# Patient Record
Sex: Female | Born: 1975 | Hispanic: Yes | Marital: Married | Smoking: Never smoker
Health system: Southern US, Community
[De-identification: ages and names within clinical notes are randomized; demographics above are authoritative.]

## PROBLEM LIST (undated history)

## (undated) DIAGNOSIS — K219 Gastro-esophageal reflux disease without esophagitis: Secondary | ICD-10-CM

## (undated) DIAGNOSIS — S0990XS Unspecified injury of head, sequela: Secondary | ICD-10-CM

## (undated) DIAGNOSIS — I10 Essential (primary) hypertension: Secondary | ICD-10-CM

## (undated) DIAGNOSIS — R5383 Other fatigue: Secondary | ICD-10-CM

## (undated) DIAGNOSIS — G4730 Sleep apnea, unspecified: Secondary | ICD-10-CM

## (undated) DIAGNOSIS — G479 Sleep disorder, unspecified: Secondary | ICD-10-CM

## (undated) DIAGNOSIS — M542 Cervicalgia: Secondary | ICD-10-CM

## (undated) DIAGNOSIS — G44309 Post-traumatic headache, unspecified, not intractable: Secondary | ICD-10-CM

## (undated) HISTORY — DX: Other fatigue: R53.83

## (undated) HISTORY — DX: Unspecified injury of head, sequela: S09.90XS

## (undated) HISTORY — DX: Unspecified injury of head, sequela: G44.309

## (undated) HISTORY — DX: Sleep disorder, unspecified: G47.9

## (undated) HISTORY — PX: ESSURE TUBAL LIGATION: SUR464

## (undated) HISTORY — DX: Cervicalgia: M54.2

---

## 2012-08-05 ENCOUNTER — Emergency Department (INDEPENDENT_AMBULATORY_CARE_PROVIDER_SITE_OTHER)
Admission: EM | Admit: 2012-08-05 | Discharge: 2012-08-05 | Disposition: A | Discharge status: Home / Self Care | Payer: Self-pay | Source: Home / Self Care | Attending: Family Medicine | Admitting: Family Medicine

## 2012-08-05 ENCOUNTER — Encounter (HOSPITAL_COMMUNITY): Payer: Self-pay | Admitting: *Deleted

## 2012-08-05 DIAGNOSIS — K0889 Other specified disorders of teeth and supporting structures: Secondary | ICD-10-CM

## 2012-08-05 DIAGNOSIS — K089 Disorder of teeth and supporting structures, unspecified: Secondary | ICD-10-CM

## 2012-08-05 MED ORDER — DICLOFENAC POTASSIUM 50 MG PO TABS: 50.0000 mg | ORAL_TABLET | Freq: Three times a day (TID) | ORAL | Status: DC

## 2012-08-05 MED ORDER — AMOXICILLIN 500 MG PO CAPS: 500.0000 mg | ORAL_CAPSULE | Freq: Three times a day (TID) | ORAL | Status: DC

## 2012-08-05 NOTE — ED Notes (Signed)
C/o R upper jaw toothache onset 2 weeks ago.  No dentist. No insurance.  She said its a bad cavity.

## 2012-08-05 NOTE — ED Provider Notes (Signed)
History     CSN: [id number]  Arrival date & time 08/05/12  1726   First MD Initiated Contact with Patient 08/05/12 1728      No chief complaint on file.   (Consider location/radiation/quality/duration/timing/severity/associated sxs/prior treatment) Patient is a 36 y.o. female presenting with tooth pain. The history is provided by the patient.  Dental PainThe primary symptoms include mouth pain. Primary symptoms do not include fever. The symptoms began more than 1 week ago. The symptoms are worsening. The symptoms occur constantly.  Additional symptoms include: dental sensitivity to temperature, gum swelling and jaw pain. Additional symptoms do not include: facial swelling, trouble swallowing and pain with swallowing.    No past medical history on file.  No past surgical history on file.  No family history on file.  History  Substance Use Topics  . Smoking status: Not on file  . Smokeless tobacco: Not on file  . Alcohol Use: Not on file    OB History    No data available      Review of Systems  Constitutional: Negative.  Negative for fever.  HENT: Positive for dental problem. Negative for facial swelling and trouble swallowing.     Allergies  Review of patient's allergies indicates not on file.  Home Medications   Current Outpatient Rx  Name Route Sig Dispense Refill  . AMOXICILLIN 500 MG PO CAPS Oral Take 1 capsule (500 mg total) by mouth 3 (three) times daily. 30 capsule 0  . DICLOFENAC POTASSIUM 50 MG PO TABS Oral Take 1 tablet (50 mg total) by mouth 3 (three) times daily. For dental pain 15 tablet 0    BP 131/84  Pulse 84  Temp 98.4 F (36.9 C) (Oral)  Resp 18  SpO2 100%  Physical Exam  Nursing note and vitals reviewed. Constitutional: She appears well-developed and well-nourished.  HENT:  Head: Normocephalic.  Right Ear: External ear normal.  Left Ear: External ear normal.  Mouth/Throat: Oropharynx is clear and moist and mucous membranes are  normal. No oropharyngeal exudate.      ED Course  Procedures (including critical care time)  Labs Reviewed - No data to display No results found.   1. Pain, dental       MDM          Linna Hoff, MD 08/05/12 1840

## 2014-03-15 ENCOUNTER — Ambulatory Visit: Payer: Self-pay | Admitting: Internal Medicine

## 2014-05-18 ENCOUNTER — Other Ambulatory Visit: Payer: Self-pay | Admitting: Internal Medicine

## 2014-05-18 ENCOUNTER — Encounter: Payer: Self-pay | Admitting: Internal Medicine

## 2014-05-18 ENCOUNTER — Ambulatory Visit (INDEPENDENT_AMBULATORY_CARE_PROVIDER_SITE_OTHER): Payer: BC Managed Care – PPO | Admitting: Internal Medicine

## 2014-05-18 DIAGNOSIS — R7402 Elevation of levels of lactic acid dehydrogenase (LDH): Secondary | ICD-10-CM

## 2014-05-18 DIAGNOSIS — R740 Nonspecific elevation of levels of transaminase and lactic acid dehydrogenase [LDH]: Secondary | ICD-10-CM

## 2014-05-18 DIAGNOSIS — R7401 Elevation of levels of liver transaminase levels: Secondary | ICD-10-CM

## 2014-05-18 DIAGNOSIS — Z9889 Other specified postprocedural states: Secondary | ICD-10-CM

## 2014-05-18 DIAGNOSIS — IMO0002 Reserved for concepts with insufficient information to code with codable children: Secondary | ICD-10-CM

## 2014-05-18 LAB — COMPREHENSIVE METABOLIC PANEL
ALBUMIN: 3.8 g/dL (ref 3.5–5.2)
ALK PHOS: 115 U/L (ref 39–117)
ALT: 41 U/L — AB (ref 0–35)
AST: 37 U/L (ref 0–37)
BUN: 12 mg/dL (ref 6–23)
CO2: 29 mEq/L (ref 19–32)
CREATININE: 0.89 mg/dL (ref 0.50–1.10)
Calcium: 9.1 mg/dL (ref 8.4–10.5)
Chloride: 100 mEq/L (ref 96–112)
Glucose, Bld: 86 mg/dL (ref 70–99)
Potassium: 4.5 mEq/L (ref 3.5–5.3)
SODIUM: 136 meq/L (ref 135–145)
Total Bilirubin: 0.5 mg/dL (ref 0.2–1.2)
Total Protein: 7.8 g/dL (ref 6.0–8.3)

## 2014-05-18 LAB — CBC WITH DIFFERENTIAL/PLATELET
BASOS ABS: 0.1 10*3/uL (ref 0.0–0.1)
Basophils Relative: 1 % (ref 0–1)
Eosinophils Absolute: 0.2 10*3/uL (ref 0.0–0.7)
Eosinophils Relative: 2 % (ref 0–5)
HEMATOCRIT: 41 % (ref 36.0–46.0)
HEMOGLOBIN: 14.1 g/dL (ref 12.0–15.0)
LYMPHS PCT: 25 % (ref 12–46)
Lymphs Abs: 3 10*3/uL (ref 0.7–4.0)
MCH: 28.4 pg (ref 26.0–34.0)
MCHC: 34.4 g/dL (ref 30.0–36.0)
MCV: 82.7 fL (ref 78.0–100.0)
MONO ABS: 0.7 10*3/uL (ref 0.1–1.0)
MONOS PCT: 6 % (ref 3–12)
NEUTROS ABS: 8 10*3/uL — AB (ref 1.7–7.7)
Neutrophils Relative %: 66 % (ref 43–77)
Platelets: 373 10*3/uL (ref 150–400)
RBC: 4.96 MIL/uL (ref 3.87–5.11)
RDW: 14.3 % (ref 11.5–15.5)
WBC: 12.1 10*3/uL — AB (ref 4.0–10.5)

## 2014-05-18 LAB — LIPID PANEL
Cholesterol: 195 mg/dL (ref 0–200)
HDL: 41 mg/dL (ref >39)
LDL CALC: 92 mg/dL (ref 0–99)
TRIGLYCERIDES: 309 mg/dL — AB (ref <150)
Total CHOL/HDL Ratio: 4.8 Ratio
VLDL: 62 mg/dL — AB (ref 0–40)

## 2014-05-19 LAB — TSH: TSH: 4.271 u[IU]/mL (ref 0.350–4.500)

## 2014-05-19 LAB — T3, FREE: T3, Free: 2.8 pg/mL (ref 2.3–4.2)

## 2014-05-19 LAB — T4, FREE: Free T4: 1.07 ng/dL (ref 0.80–1.80)

## 2014-05-21 ENCOUNTER — Telehealth: Payer: Self-pay | Admitting: Internal Medicine

## 2014-05-21 DIAGNOSIS — IMO0002 Reserved for concepts with insufficient information to code with codable children: Secondary | ICD-10-CM | POA: Insufficient documentation

## 2014-05-21 DIAGNOSIS — R740 Nonspecific elevation of levels of transaminase and lactic acid dehydrogenase [LDH]: Secondary | ICD-10-CM

## 2014-05-21 DIAGNOSIS — E669 Obesity, unspecified: Secondary | ICD-10-CM | POA: Insufficient documentation

## 2014-05-21 DIAGNOSIS — R7401 Elevation of levels of liver transaminase levels: Secondary | ICD-10-CM | POA: Insufficient documentation

## 2014-05-21 NOTE — Telephone Encounter (Signed)
Virginia [name]     Call this pt and let her know that her white blood cell count, her triglycerides and her liver function test are all slightly abnormal  .  Give her 30 min follow up in office with me     Route back to me with appt date  Thanks

## 2014-05-21 NOTE — Progress Notes (Signed)
Subjective:    Patient ID: Virginia [name] [name], female    DOB: 1976/06/17, 38 y.o.   MRN: [id number]  HPI  Virginia [name] is a new pt here for primary care.  She has been in [place] since 2011 formerly [place].  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  She tells me she has upcoming appt with GYN soon  PMH morbid obesity,  S/P hysteroscopic sterilization (IN [place]), and she tells she she was told she had abnormal liver blood test some time ago.  She denies hepatitis exposure or remote blood transfusion and is asymptomatic  LMP 02/2014 she does states she will  " skip months"    [id number] with one ectopic.    [place] pap 2008 [place]  She wishes to discuss weight loss.  She reports that in 2008 she was able to lose weight by vegetarian diet.    She did start at a gym for exercise in 01/2014 but is having difficulty .  Has not had thyroid or other blood work in quite a few years. .    No Known Allergies Past Medical History  Diagnosis Date  . Fatigue   . Sleep difficulties   . Neck pain   . Headaches due to old head injury    Past Surgical History  Procedure Laterality Date  . Essure tubal ligation  20007   History   Social History  . Marital Status: Married    Spouse Name: N/A    Number of Children: N/A  . Years of Education: N/A   Occupational History  . Not on file.   Social History Main Topics  . Smoking status: Never Smoker   . Smokeless tobacco: Not on file  . Alcohol Use: No  . Drug Use: No  . Sexual Activity: Yes    Partners: Male    Birth Control/ Protection: None   Other Topics Concern  . Not on file   Social History Narrative  . No narrative on file   No family history on file. Patient Active Problem List   Diagnosis Date Noted  . Morbid obesity 05/21/2014  . Elevated ALT measurement 05/21/2014  .  Histoyr of Essure hysteroscopic sterilization 2007 05/21/2014   Current Outpatient Prescriptions on File Prior to Visit  Medication Sig Dispense Refill  . acetaminophen  (TYLENOL) 500 MG tablet Take 1,000 mg by mouth every 6 (six) hours as needed.       No current facility-administered medications on file prior to visit.      Review of Systems See HPI    Objective:   Physical Exam Physical Exam  Nursing note and vitals reviewed.  Constitutional: She is oriented to person, place, and time. She appears well-developed and well-nourished.  HENT:  Head: Normocephalic and atraumatic.  Cardiovascular: Normal rate and regular rhythm. Exam reveals no gallop and no friction rub.  No murmur heard.  Pulmonary/Chest: Breath sounds normal. She has no wheezes. She has no rales.  Neurological: She is alert and oriented to person, place, and time.  Skin: Skin is warm and dry.  Psychiatric: She has a normal mood and affect. Her behavior is normal.         Assessment & Plan:  Morbid obesity:  Will get TSH and all labs today.  ADvised Weight to wellness class which starts in September.  Also gave copy of 3 day diet.   History  of abnormal  lfts  Will check today  Further management based on results  Hysteroscopic sterilization with  ESsure  Has gyn appt soon

## 2014-05-22 NOTE — Telephone Encounter (Signed)
Left message for [name] to call the office

## 2014-05-24 ENCOUNTER — Ambulatory Visit (INDEPENDENT_AMBULATORY_CARE_PROVIDER_SITE_OTHER): Payer: BC Managed Care – PPO | Admitting: Internal Medicine

## 2014-05-24 ENCOUNTER — Encounter: Payer: Self-pay | Admitting: Internal Medicine

## 2014-05-24 VITALS — BP 120/78 | HR 77 | Resp 16 | Ht 66.5 in | Wt 295.0 lb

## 2014-05-24 DIAGNOSIS — D72829 Elevated white blood cell count, unspecified: Secondary | ICD-10-CM

## 2014-05-24 DIAGNOSIS — R7989 Other specified abnormal findings of blood chemistry: Secondary | ICD-10-CM

## 2014-05-24 DIAGNOSIS — E781 Pure hyperglyceridemia: Secondary | ICD-10-CM | POA: Insufficient documentation

## 2014-05-24 DIAGNOSIS — E669 Obesity, unspecified: Secondary | ICD-10-CM

## 2014-05-24 DIAGNOSIS — R945 Abnormal results of liver function studies: Secondary | ICD-10-CM

## 2014-05-24 LAB — CBC WITH DIFFERENTIAL/PLATELET
BASOS PCT: 1 % (ref 0–1)
Basophils Absolute: 0.1 10*3/uL (ref 0.0–0.1)
EOS ABS: 0.2 10*3/uL (ref 0.0–0.7)
Eosinophils Relative: 2 % (ref 0–5)
HEMATOCRIT: 39.7 % (ref 36.0–46.0)
HEMOGLOBIN: 13.9 g/dL (ref 12.0–15.0)
Lymphocytes Relative: 22 % (ref 12–46)
Lymphs Abs: 2.5 10*3/uL (ref 0.7–4.0)
MCH: 28.7 pg (ref 26.0–34.0)
MCHC: 35 g/dL (ref 30.0–36.0)
MCV: 82 fL (ref 78.0–100.0)
MONO ABS: 0.7 10*3/uL (ref 0.1–1.0)
MONOS PCT: 6 % (ref 3–12)
Neutro Abs: 7.9 10*3/uL — ABNORMAL HIGH (ref 1.7–7.7)
Neutrophils Relative %: 69 % (ref 43–77)
Platelets: 350 10*3/uL (ref 150–400)
RBC: 4.84 MIL/uL (ref 3.87–5.11)
RDW: 14.2 % (ref 11.5–15.5)
WBC: 11.5 10*3/uL — ABNORMAL HIGH (ref 4.0–10.5)

## 2014-05-24 LAB — COMPREHENSIVE METABOLIC PANEL
ALK PHOS: 117 U/L (ref 39–117)
ALT: 40 U/L — ABNORMAL HIGH (ref 0–35)
AST: 28 U/L (ref 0–37)
Albumin: 3.9 g/dL (ref 3.5–5.2)
BILIRUBIN TOTAL: 0.5 mg/dL (ref 0.2–1.2)
BUN: 11 mg/dL (ref 6–23)
CO2: 25 mEq/L (ref 19–32)
CREATININE: 0.86 mg/dL (ref 0.50–1.10)
Calcium: 9.2 mg/dL (ref 8.4–10.5)
Chloride: 101 mEq/L (ref 96–112)
GLUCOSE: 89 mg/dL (ref 70–99)
Potassium: 4.4 mEq/L (ref 3.5–5.3)
Sodium: 136 mEq/L (ref 135–145)
Total Protein: 7.8 g/dL (ref 6.0–8.3)

## 2014-05-24 NOTE — Addendum Note (Signed)
Addended by: Raechel ChuteSCHOENHOFF, DEBORAH D on: 05/24/2014 02:32 PM   Modules accepted: Orders

## 2014-05-24 NOTE — Progress Notes (Signed)
   Subjective:    Patient ID: Virginia [name], female    DOB: [birth year], 38 y.o.   MRN: [id number]  HPI Virginia [name] is here to follow up on lab abnormalities  Slight elevation in WBC  No infectious symptoms.    Increased TG's  Minimal elevation alt measurement:  Pt states many years ago in [place] she was seen by a specialist for abnormal liver blood test.  She denies hepatitis and states rare ETOH consumption   No Known Allergies Past Medical History  Diagnosis Date  . Fatigue   . Sleep difficulties   . Neck pain   . Headaches due to old head injury    Past Surgical History  Procedure Laterality Date  . Essure tubal ligation  20007   History   Social History  . Marital Status: Married    Spouse Name: N/A    Number of Children: N/A  . Years of Education: N/A   Occupational History  . Not on file.   Social History Main Topics  . Smoking status: Never Smoker   . Smokeless tobacco: Not on file  . Alcohol Use: No  . Drug Use: No  . Sexual Activity: Yes    Partners: Male    Birth Control/ Protection: None, Surgical   Other Topics Concern  . Not on file   Social History Narrative  . No narrative on file   History reviewed. No pertinent family history. Patient Active Problem List   Diagnosis Date Noted  . Hypertriglyceridemia 05/24/2014  . Morbid obesity 05/21/2014  . Elevated ALT measurement 05/21/2014  .  Histoyr of Essure hysteroscopic sterilization 2007 05/21/2014   Current Outpatient Prescriptions on File Prior to Visit  Medication Sig Dispense Refill  . acetaminophen (TYLENOL) 500 MG tablet Take 1,000 mg by mouth every 6 (six) hours as needed.       No current facility-administered medications on file prior to visit.       Review of Systems See HPI    Objective:   Physical Exam Physical Exam  Nursing note and vitals reviewed.  Constitutional: She is oriented to person, place, and time. She appears well-developed and well-nourished.  HENT:    Head: Normocephalic and atraumatic.  Cardiovascular: Normal rate and regular rhythm. Exam reveals no gallop and no friction rub.  No murmur heard.  Pulmonary/Chest: Breath sounds normal. She has no wheezes. She has no rales.  Neurological: She is alert and oriented to person, place, and time.  Skin: Skin is warm and dry.  Psychiatric: She has a normal mood and affect. Her behavior is normal.              Assessment & Plan:  elevatd TG's gave dash diet recheck 6-12 months  Alt elevated  Will get liver U/S  Get ana, ferritin, and repeat labs today   Elevated:  WBC will check today   Obesity:  Will set up nutrition referral

## 2014-05-24 NOTE — Patient Instructions (Signed)
Will refer to nutrition counseling    To lab today     To xray today to schedule ultrasound

## 2014-05-25 LAB — ANA: Anti Nuclear Antibody(ANA): NEGATIVE

## 2014-05-25 LAB — FERRITIN: FERRITIN: 102 ng/mL (ref 10–291)

## 2014-05-28 ENCOUNTER — Ambulatory Visit (HOSPITAL_BASED_OUTPATIENT_CLINIC_OR_DEPARTMENT_OTHER): Admission: RE | Admit: 2014-05-28 | Payer: BC Managed Care – PPO | Source: Ambulatory Visit

## 2014-06-02 ENCOUNTER — Ambulatory Visit (HOSPITAL_BASED_OUTPATIENT_CLINIC_OR_DEPARTMENT_OTHER)
Admission: RE | Admit: 2014-06-02 | Discharge: 2014-06-02 | Disposition: A | Discharge status: Home / Self Care | Payer: BC Managed Care – PPO | Source: Ambulatory Visit | Attending: Internal Medicine | Admitting: Internal Medicine

## 2014-06-02 ENCOUNTER — Ambulatory Visit (HOSPITAL_BASED_OUTPATIENT_CLINIC_OR_DEPARTMENT_OTHER): Payer: BC Managed Care – PPO

## 2014-06-02 DIAGNOSIS — R945 Abnormal results of liver function studies: Secondary | ICD-10-CM

## 2014-06-02 DIAGNOSIS — R7989 Other specified abnormal findings of blood chemistry: Secondary | ICD-10-CM | POA: Insufficient documentation

## 2014-07-25 ENCOUNTER — Ambulatory Visit: Payer: BC Managed Care – PPO | Admitting: Dietician

## 2014-08-28 ENCOUNTER — Encounter: Payer: Self-pay | Admitting: Internal Medicine

## 2014-09-27 ENCOUNTER — Ambulatory Visit: Payer: BC Managed Care – PPO | Admitting: Internal Medicine

## 2014-10-04 ENCOUNTER — Ambulatory Visit (INDEPENDENT_AMBULATORY_CARE_PROVIDER_SITE_OTHER): Payer: BC Managed Care – PPO | Admitting: Internal Medicine

## 2014-10-04 ENCOUNTER — Encounter: Payer: Self-pay | Admitting: Internal Medicine

## 2014-10-04 ENCOUNTER — Ambulatory Visit (HOSPITAL_BASED_OUTPATIENT_CLINIC_OR_DEPARTMENT_OTHER)
Admission: RE | Admit: 2014-10-04 | Discharge: 2014-10-04 | Disposition: A | Discharge status: Home / Self Care | Payer: BC Managed Care – PPO | Source: Ambulatory Visit | Attending: Internal Medicine | Admitting: Internal Medicine

## 2014-10-04 VITALS — BP 125/76 | HR 80 | Resp 16 | Ht 65.5 in | Wt 303.0 lb

## 2014-10-04 DIAGNOSIS — M25551 Pain in right hip: Secondary | ICD-10-CM | POA: Insufficient documentation

## 2014-10-04 LAB — POCT URINALYSIS DIPSTICK
BILIRUBIN UA: NEGATIVE
Blood, UA: NEGATIVE
GLUCOSE UA: NEGATIVE
Ketones, UA: NEGATIVE
LEUKOCYTES UA: NEGATIVE
NITRITE UA: NEGATIVE
PH UA: 6.5
Protein, UA: NEGATIVE
Spec Grav, UA: 1.01
Urobilinogen, UA: NEGATIVE

## 2014-10-04 MED ORDER — IBUPROFEN 800 MG PO TABS: ORAL_TABLET | ORAL | Status: DC

## 2014-10-04 NOTE — Progress Notes (Signed)
   Subjective:    Patient ID: Virginia [name], female    DOB: 24-Jan-1976, 38 y.o.   MRN: [id number]  HPI  04/2014 note elevatd TG's gave dash diet recheck 6-12 months  Alt elevated Will get liver U/S Get ana, ferritin, and repeat labs today   Elevated: WBC will check today   Obesity: Will set up nutrition referral   TODAY:  Virginia [name] is here for acute visit  2 weeks right hip pain and pain in both legs  No edema  No known injury.  Sits at computer at work no lifting     No Known Allergies Past Medical History  Diagnosis Date  . Fatigue   . Sleep difficulties   . Neck pain   . Headaches due to old head injury    Past Surgical History  Procedure Laterality Date  . Essure tubal ligation  20007   History   Social History  . Marital Status: Married    Spouse Name: N/A    Number of Children: N/A  . Years of Education: N/A   Occupational History  . Not on file.   Social History Main Topics  . Smoking status: Never Smoker   . Smokeless tobacco: Not on file  . Alcohol Use: No  . Drug Use: No  . Sexual Activity:    Partners: Male    Birth Control/ Protection: None, Surgical   Other Topics Concern  . Not on file   Social History Narrative   No family history on file. Patient Active Problem List   Diagnosis Date Noted  . Hypertriglyceridemia 05/24/2014  . Morbid obesity 05/21/2014  . Elevated ALT measurement 05/21/2014  .  Histoyr of Essure hysteroscopic sterilization 2007 05/21/2014   Current Outpatient Prescriptions on File Prior to Visit  Medication Sig Dispense Refill  . acetaminophen (TYLENOL) 500 MG tablet Take 1,000 mg by mouth every 6 (six) hours as needed.     No current facility-administered medications on file prior to visit.      Review of Systems See HPI.     Objective:   Physical Exam Physical Exam  Nursing note and vitals reviewed.  Constitutional: She is oriented to person, place, and time. She appears well-developed and  well-nourished.  HENT:  Head: Normocephalic and atraumatic.  Cardiovascular: Normal rate and regular rhythm. Exam reveals no gallop and no friction rub.  No murmur heard.  Pulmonary/Chest: Breath sounds normal. She has no wheezes. She has no rales.  Neurological: She is alert and oriented to person, place, and time.  Skin: Skin is warm and dry.  M/S  R  hip  Pain with I/E rotation  Bil PP  No muscle pain to palpation either leg  Psychiatric: She has a normal mood and affect. Her behavior is normal.      Assessment & Plan:  R hip pain  [facility]   Morbid obesity   See me next week

## 2014-10-04 NOTE — Patient Instructions (Signed)
To xray   See me 30 mins next week

## 2014-10-06 NOTE — Progress Notes (Signed)
Virginia [name] is aware of her xray results-eh

## 2014-10-11 ENCOUNTER — Ambulatory Visit: Payer: BC Managed Care – PPO | Admitting: Internal Medicine

## 2015-01-10 ENCOUNTER — Encounter (HOSPITAL_COMMUNITY): Payer: Self-pay | Admitting: *Deleted

## 2015-01-10 ENCOUNTER — Emergency Department (HOSPITAL_COMMUNITY)
Admission: EM | Admit: 2015-01-10 | Discharge: 2015-01-10 | Disposition: A | Discharge status: Home / Self Care | Payer: BLUE CROSS/BLUE SHIELD | Source: Home / Self Care | Attending: Family Medicine | Admitting: Family Medicine

## 2015-01-10 DIAGNOSIS — K648 Other hemorrhoids: Secondary | ICD-10-CM

## 2015-01-10 DIAGNOSIS — K644 Residual hemorrhoidal skin tags: Secondary | ICD-10-CM

## 2015-01-10 MED ORDER — HYDROCORTISONE ACE-PRAMOXINE 1-1 % RE FOAM: 1.0000 | Freq: Two times a day (BID) | RECTAL | Status: DC

## 2015-01-10 MED ORDER — DOCUSATE SODIUM 100 MG PO CAPS: 100.0000 mg | ORAL_CAPSULE | Freq: Two times a day (BID) | ORAL | Status: DC

## 2015-01-10 NOTE — ED Provider Notes (Signed)
CSN: [id number]     Arrival date & time 01/10/15  1840 History   First MD Initiated Contact with Patient 01/10/15 1958     Chief Complaint  Patient presents with  . Rectal Pain   (Consider location/radiation/quality/duration/timing/severity/associated sxs/prior Treatment) HPI        39 year old female presents complaining of possible hemorrhoid. She has increasing rectal pain and some rectal bleeding for the past 5 days. She has a history of hemorrhoids after she gave birth but none before or since then. She denies any recent constipation and denies any other symptoms. Pain is severe, and she is unable to sit down.  Past Medical History  Diagnosis Date  . Fatigue   . Sleep difficulties   . Neck pain   . Headaches due to old head injury    Past Surgical History  Procedure Laterality Date  . Essure tubal ligation  20007   History reviewed. No pertinent family history. History  Substance Use Topics  . Smoking status: Never Smoker   . Smokeless tobacco: Never Used  . Alcohol Use: No   OB History    Gravida Para Term Preterm AB TAB SAB Ectopic Multiple Living   1         2     Review of Systems  Gastrointestinal: Positive for anal bleeding and rectal pain.  All other systems reviewed and are negative.   Allergies  Review of patient's allergies indicates no known allergies.  Home Medications   Prior to Admission medications   Medication Sig Start Date End Date Taking? Authorizing Provider  acetaminophen (TYLENOL) 500 MG tablet Take 1,000 mg by mouth every 6 (six) hours as needed.    Historical Provider, MD  docusate sodium (COLACE) 100 MG capsule Take 1 capsule (100 mg total) by mouth every 12 (twelve) hours. 01/10/15   Graylon GoodZachary H Tanya Crothers, PA-C  hydrocortisone-pramoxine [facility](PROCTOFOAM-HC) rectal foam Place 1 applicator rectally 2 (two) times daily. 01/10/15   Graylon GoodZachary H Orpheus [place], PA-C  ibuprofen (ADVIL,MOTRIN) 800 MG tablet Take one bid for 10 days 10/04/14   Kendrick Rancheborah D Schoenhoff, MD   Naproxen Sodium (ALEVE PO) Take by mouth.    Historical Provider, MD   BP 121/72 mmHg  Pulse 106  Temp(Src) 98.7 F (37.1 C) (Oral)  Resp 15  SpO2 99%  LMP 01/01/2015 Physical Exam  Constitutional: She is oriented to person, place, and time. Vital signs are normal. She appears well-developed and well-nourished. She appears distressed (uncomfortable appearing).  Obese  HENT:  Head: Normocephalic and atraumatic.  Pulmonary/Chest: Effort normal. No respiratory distress.  Genitourinary: Rectal exam shows external hemorrhoid (numerous).  Rectal exam deferred due to hemorrhoid pain  Neurological: She is alert and oriented to person, place, and time. She has normal strength. Coordination normal.  Skin: Skin is warm and dry. No rash noted. She is not diaphoretic.  Psychiatric: She has a normal mood and affect. Judgment normal.  Nursing note and vitals reviewed.   ED Course  Procedures (including critical care time) Labs Review Labs Reviewed - No data to display  Imaging Review No results found.   MDM   1. Inflamed external hemorrhoid    Treat with proctofoam, Colace, follow-up with primary care if no improvement in a couple days  Meds ordered this encounter  Medications  . hydrocortisone-pramoxine (PROCTOFOAM-HC) rectal foam    Sig: Place 1 applicator rectally 2 (two) times daily.    Dispense:  10 g    Refill:  0  . docusate sodium (COLACE)  100 MG capsule    Sig: Take 1 capsule (100 mg total) by mouth every 12 (twelve) hours.    Dispense:  60 capsule    Refill:  0      Graylon Good, PA-C 01/10/15 2024

## 2015-01-10 NOTE — Discharge Instructions (Signed)

## 2015-01-10 NOTE — ED Notes (Signed)
Pt is here with complaints of rectal pain and bleeding since 01/05/2015

## 2018-09-20 ENCOUNTER — Other Ambulatory Visit (HOSPITAL_COMMUNITY): Payer: Self-pay | Admitting: General Surgery

## 2018-09-29 ENCOUNTER — Other Ambulatory Visit: Payer: Self-pay | Admitting: Obstetrics & Gynecology

## 2018-09-29 DIAGNOSIS — R928 Other abnormal and inconclusive findings on diagnostic imaging of breast: Secondary | ICD-10-CM

## 2018-10-07 ENCOUNTER — Ambulatory Visit (HOSPITAL_COMMUNITY)
Admission: RE | Admit: 2018-10-07 | Discharge: 2018-10-07 | Disposition: A | Discharge status: Home / Self Care | Payer: Managed Care, Other (non HMO) | Source: Ambulatory Visit | Attending: General Surgery | Admitting: General Surgery

## 2018-10-07 ENCOUNTER — Other Ambulatory Visit: Payer: Self-pay

## 2018-10-07 ENCOUNTER — Encounter: Payer: Managed Care, Other (non HMO) | Attending: General Surgery | Admitting: Skilled Nursing Facility1

## 2018-10-07 ENCOUNTER — Encounter: Payer: Self-pay | Admitting: Skilled Nursing Facility1

## 2018-10-07 NOTE — Progress Notes (Signed)
Pre-Op Assessment Visit:  Pre-Operative Sleeve Gastrectomy Surgery  Medical Nutrition Therapy:  Appt start time: 2:30  End time:  3:30  Patient was seen on 12/12/for Pre-Operative Nutrition Assessment. Assessment and [facility] on  10/07/2018  Pt will weigh with her shoes off.   According to pts referral: 4 SWL appts required  Pt states she currently is taking phentermine. Pt states she is picky and does not eat rice and bread. Pt states she has avoiding energy sources due to wanting weight loss. Pt states she is sensitive to smells causing her not to eat. Pt states she has realized she is an eater when she is bored. Pt states he does not eat any seafood stating she vomits just from the smell..  Pt has already been working on a lot of lifestyle changes.   Pt expectation of surgery: a boost  Pt expectation of Dietitian: to educate   Start weight at [facility]: 254 BMI: 42.27  24 hr Dietary Recall: First Meal: slim fast shake someitmes with a banana or cheese eggs and bacon Snack: Second Meal: sandwich wrap or salad: chicken, lettuce, tomato, carrots, cucumbers, cheese with balsamic  Snack:  Third Meal: skipped or chicken and spinach Snack: Beverages: soda (40 ounces), water, coffee, hot tea with sugar and milk, lemon tea  Encouraged to engage in 150 minutes of moderate physical activity including cardiovascular and weight baring weekly  Handouts given during visit include:  . Pre-Op Goals . Bariatric Surgery Protein Shakes During the appointment today the following Pre-Op Goals were reviewed with the patient: . Log your food and beverage  . Make healthy food choices . Begin to limit portion sizes . Limited concentrated sugars and fried foods . Keep fat/sugar in the single digits per serving on             food labels . Practice CHEWING your food  (aim for 30 chews per bite or until applesauce  consistency) . Practice not drinking 15 minutes before, during, and 30 minutes after each meal/snack . Avoid all carbonated beverages  . Avoid/limit caffeinated beverages  . Avoid all sugar-sweetened beverages . Consume 3 meals per day; eat every 3-5 hours . Make a list of non-food related activities . Aim for 64-100 ounces of FLUID daily  . Aim for at least 60-80 grams of PROTEIN daily . Look for a liquid protein source that contain ?15 g protein and ?5 g carbohydrate  (ex: shakes, drinks, shots)  -Follow diet recommendations listed below   Energy and Macronutrient Recomendations: Calories: 1500 Carbohydrate: 170 Protein: 112 Fat: 42  Demonstrated degree of understanding via:  Teach Back  Teaching Method Utilized:  Visual Auditory Hands on  Barriers to learning/adherence to lifestyle change: none identified   Patient to call the Nutrition and Diabetes Education Services to enroll in Pre-Op and Post-Op Nutrition Education when surgery date is scheduled.

## 2018-10-15 ENCOUNTER — Ambulatory Visit
Admission: RE | Admit: 2018-10-15 | Discharge: 2018-10-15 | Disposition: A | Discharge status: Home / Self Care | Payer: BLUE CROSS/BLUE SHIELD | Source: Ambulatory Visit | Attending: Obstetrics & Gynecology | Admitting: Obstetrics & Gynecology

## 2018-10-15 ENCOUNTER — Ambulatory Visit
Admission: RE | Admit: 2018-10-15 | Discharge: 2018-10-15 | Disposition: A | Discharge status: Home / Self Care | Payer: Managed Care, Other (non HMO) | Source: Ambulatory Visit | Attending: Obstetrics & Gynecology | Admitting: Obstetrics & Gynecology

## 2018-10-15 ENCOUNTER — Other Ambulatory Visit: Payer: Self-pay | Admitting: Obstetrics & Gynecology

## 2018-10-15 DIAGNOSIS — R928 Other abnormal and inconclusive findings on diagnostic imaging of breast: Secondary | ICD-10-CM

## 2018-10-15 DIAGNOSIS — N6489 Other specified disorders of breast: Secondary | ICD-10-CM

## 2018-11-08 ENCOUNTER — Ambulatory Visit: Payer: Managed Care, Other (non HMO) | Admitting: Skilled Nursing Facility1

## 2018-11-12 ENCOUNTER — Encounter: Payer: Self-pay | Admitting: Dietician

## 2018-11-12 ENCOUNTER — Encounter: Payer: Managed Care, Other (non HMO) | Attending: General Surgery | Admitting: Dietician

## 2018-11-12 VITALS — Wt 258.0 lb

## 2018-11-12 DIAGNOSIS — E669 Obesity, unspecified: Secondary | ICD-10-CM

## 2018-11-12 DIAGNOSIS — Z6841 Body Mass Index (BMI) 40.0 and over, adult: Secondary | ICD-10-CM | POA: Diagnosis not present

## 2018-11-12 DIAGNOSIS — Z713 Dietary counseling and surveillance: Secondary | ICD-10-CM | POA: Insufficient documentation

## 2018-11-12 NOTE — Patient Instructions (Addendum)
   Use the Meal Ideas sheet to create new ideas for well-balanced meals.   Continue aiming for 64-100 ounces of fluid per day, with at least half being plain water.   Continue cutting back on sodas to avoid carbonation and sugar-sweetened beverages.   Keep tracking your fluids and start tracking your foods to help determine your protein intake. Remember the protein goal is 60 grams/day.
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
## 2018-11-23 ENCOUNTER — Ambulatory Visit (INDEPENDENT_AMBULATORY_CARE_PROVIDER_SITE_OTHER): Payer: Managed Care, Other (non HMO) | Admitting: Neurology

## 2018-11-23 ENCOUNTER — Encounter: Payer: Self-pay | Admitting: Neurology

## 2018-11-23 VITALS — BP 151/100 | HR 106 | Ht 66.0 in | Wt 260.0 lb

## 2018-11-23 DIAGNOSIS — G4719 Other hypersomnia: Secondary | ICD-10-CM

## 2018-11-23 DIAGNOSIS — R030 Elevated blood-pressure reading, without diagnosis of hypertension: Secondary | ICD-10-CM | POA: Diagnosis not present

## 2018-11-23 DIAGNOSIS — Z9189 Other specified personal risk factors, not elsewhere classified: Secondary | ICD-10-CM | POA: Diagnosis not present

## 2018-11-23 DIAGNOSIS — R0683 Snoring: Secondary | ICD-10-CM | POA: Diagnosis not present

## 2018-11-23 DIAGNOSIS — Z6841 Body Mass Index (BMI) 40.0 and over, adult: Secondary | ICD-10-CM

## 2018-11-23 NOTE — Patient Instructions (Signed)

## 2018-11-23 NOTE — Progress Notes (Signed)
Subjective:    Patient ID: Virginia [name] is a 43 y.o. female.  HPI     Virginia Foley, MD, PhD [facility] [address], [address] [address] [place], [place] [address]  Dear Dr. Johna [name],  I saw your patient, Virginia [name], upon your kind request in my sleep clinic today for initial consultation of her sleep disorder, in particular, concern for underlying obstructive sleep apnea. The patient is unaccompanied today. As you know, Virginia [name] is a 43 year old right-handed woman with an underlying medical history of hypothyroidism and morbid obesity with a BMI of over 40, who is being considered for weight loss surgery. I reviewed your office note from 09/10/2018, which you kindly included. Her Epworth sleepiness score is 14 out of 24, fatigue score is 36 out of 63. She lives with her husband and children, she works as a Best boy for [employer] She is a nonsmoker and does not utilize alcohol, except on rare occasions, drinks caffeine in the form of soda and coffee, 16 ounce per day on average total. Per husband, she snores loudly. No night to night nocturia, has had some early morning awakenings. She has a 42 yo daughter, a 21 yo son and 53 yo stepson in [place]. She denies any difficulty with sleep onset. She has one cat. There is no TV in the bedroom. She admits to having dozed off at the wheel before. Her bedtime is generally between 10 and midnight, rise time between 5 and 5:30 AM. She denies difficulty going to sleep, she is not aware of any family history of OSA.   Her Past Medical History Is Significant For: Past Medical History:  Diagnosis Date  . Fatigue   . Headaches due to old head injury   . Neck pain   . Sleep difficulties     Her Past Surgical History Is Significant For: Past Surgical History:  Procedure Laterality Date  . ESSURE TUBAL LIGATION  20007    Her Family History Is Significant For: No family history on  file.  Her Social History Is Significant For: Social History   Socioeconomic History  . Marital status: Married    Spouse name: Not on file  . Number of children: Not on file  . Years of education: Not on file  . Highest education level: Not on file  Occupational History  . Not on file  Social Needs  . Financial resource strain: Not on file  . Food insecurity:    Worry: Not on file    Inability: Not on file  . Transportation needs:    Medical: Not on file    Non-medical: Not on file  Tobacco Use  . Smoking status: Never Smoker  . Smokeless tobacco: Never Used  Substance and Sexual Activity  . Alcohol use: No    Alcohol/week: 0.0 standard drinks  . Drug use: No  . Sexual activity: Yes    Partners: Male    Birth control/protection: None, Surgical  Lifestyle  . Physical activity:    Days per week: Not on file    Minutes per session: Not on file  . Stress: Not on file  Relationships  . Social connections:    Talks on phone: Not on file    Gets together: Not on file    Attends religious service: Not on file    Active member of club or organization: Not on file    Attends meetings of clubs or organizations: Not on file    Relationship  status: Not on file  Other Topics Concern  . Not on file  Social History Narrative  . Not on file    Her Allergies Are:  No Known Allergies:   Her Current Medications Are:  Outpatient Encounter Medications as of 11/23/2018  Medication Sig  . acetaminophen (TYLENOL) 500 MG tablet Take 1,000 mg by mouth every 6 (six) hours as needed.  Marland Kitchen levothyroxine (SYNTHROID, LEVOTHROID) 25 MCG tablet Take 25 mcg by mouth daily.  . phentermine 37.5 MG capsule Take 37.5 mg by mouth every morning.  . [DISCONTINUED] docusate sodium (COLACE) 100 MG capsule Take 1 capsule (100 mg total) by mouth every 12 (twelve) hours.  . [DISCONTINUED] hydrocortisone-pramoxine (PROCTOFOAM-HC) rectal foam Place 1 applicator rectally 2 (two) times daily.  .  [DISCONTINUED] ibuprofen (ADVIL,MOTRIN) 800 MG tablet Take one bid for 10 days  . [DISCONTINUED] Naproxen Sodium (ALEVE PO) Take by mouth.   No facility-administered encounter medications on file as of 11/23/2018.   :  Review of Systems:  Out of a complete 14 point review of systems, all are reviewed and negative with the exception of these symptoms as listed below:  Review of Systems  Neurological:       Pt presents today to discuss her sleep. Pt has never had a sleep study but does endorse snoring.  Epworth Sleepiness Scale 0= would never doze 1= slight chance of dozing 2= moderate chance of dozing 3= high chance of dozing  Sitting and reading: 0 Watching TV: 2 Sitting inactive in a public place (ex. Theater or meeting): 3 As a passenger in a car for an hour without a break: 2 Lying down to rest in the afternoon: 3 Sitting and talking to someone: 2 Sitting quietly after lunch (no alcohol): 0 In a car, while stopped in traffic: 2 Total: 14     Objective:  Neurological Exam  Physical Exam Physical Examination:   Vitals:   11/23/18 1552  BP: (!) 151/100  Pulse: (!) 106    General Examination: The patient is a very pleasant 43 y.o. female in no acute distress. She appears well-developed and well-nourished and well groomed.   HEENT: Normocephalic, atraumatic, pupils are equal, round and reactive to light and accommodation. Funduscopic exam is normal with sharp disc margins noted. Extraocular tracking is good without limitation to gaze excursion or nystagmus noted. Normal smooth pursuit is noted. Hearing is grossly intact. Tympanic membranes are clear bilaterally. Face is symmetric with normal facial animation and normal facial sensation. Speech is clear with no dysarthria noted. There is no hypophonia. There is no lip, neck/head, jaw or voice tremor. Neck is supple with full range of passive and active motion. There are no carotid bruits on auscultation. Oropharynx exam  reveals: mild mouth dryness, adequate dental hygiene and moderate airway crowding, due to larger tonsils of 3+, larger. Uvula. Mallampati is class 3, neck circumference 15-5/8 inches. She has a mild overbite. Tongue protrudes centrally and palate elevates symmetrically.  Chest: Clear to auscultation without wheezing, rhonchi or crackles noted.  Heart: S1+S2+0, regular and normal without murmurs, rubs or gallops noted.   Abdomen: Soft, non-tender and non-distended with normal bowel sounds appreciated on auscultation.  Extremities: There is no edema in the legs noted.   Skin: Warm and dry without trophic changes noted.   Musculoskeletal: exam reveals no obvious joint deformities, tenderness or joint swelling or erythema.   Neurologically:  Mental status: The patient is awake, alert and oriented in all 4 spheres. Her immediate and remote  memory, attention, language skills and fund of knowledge are appropriate. There is no evidence of aphasia, agnosia, apraxia or anomia. Speech is clear with normal prosody and enunciation. Thought process is linear. Mood is normal and affect is normal.  Cranial nerves II - XII are as described above under HEENT exam. In addition: shoulder shrug is normal with equal shoulder height noted. Motor exam: Normal bulk, strength and tone is noted. There is no drift, tremor or rebound. Romberg is negative. Fine motor skills and coordination: grossly intact.  Cerebellar testing: No dysmetria or intention tremor. There is no truncal or gait ataxia.  Sensory exam: intact to light touch in the upper and lower extremities.  Gait, station and balance: She stands easily. No veering to one side is noted. No leaning to one side is noted. Posture is age-appropriate and stance is narrow based. Gait shows normal stride length and normal pace. No problems turning are noted. Tandem walk is unremarkable.   Assessment and Plan:   In summary, Virginia [name] is a very pleasant  43 y.o.-year old female  with an underlying medical history of hypothyroidism and morbid obesity with a BMI of over 40, whose history and physical exam are concerning for obstructive sleep apnea (OSA). Since she is being considered for bariatric surgery, underlying obstructive sleep apnea should be excluded and treated if found. I had a long chat with the patient about my findings and the diagnosis of OSA, its prognosis and treatment options. We talked about medical treatments, surgical interventions and non-pharmacological approaches. I explained in particular the risks and ramifications of untreated moderate to severe OSA, especially with respect to developing cardiovascular disease down the Road, including congestive heart failure, difficult to treat hypertension, cardiac arrhythmias, or stroke. Even type 2 diabetes has, in part, been linked to untreated OSA. Symptoms of untreated OSA include daytime sleepiness, memory problems, mood irritability and mood disorder such as depression and anxiety, lack of energy, as well as recurrent headaches, especially morning headaches. We talked about trying to maintain a healthy lifestyle in general, as well as the importance of weight control. I encouraged the patient to eat healthy, exercise daily and keep well hydrated, to keep a scheduled bedtime and wake time routine, to not skip any meals and eat healthy snacks in between meals. I advised the patient not to drive when feeling sleepy. I recommended the following at this time: sleep study with potential positive airway pressure titration. (We will score hypopneas at 4%).   I explained the sleep test procedure to the patient and also outlined possible surgical and non-surgical treatment options of OSA, including the use of a custom-made dental device (which would require a referral to a specialist dentist or oral surgeon), upper airway surgical options, such as pillar implants, radiofrequency surgery, tongue base  surgery, and UPPP (which would involve a referral to an ENT surgeon). Rarely, jaw surgery such as mandibular advancement may be considered.  I also explained the CPAP treatment option to the patient, who indicated that she would be willing to try CPAP if the need arises. I explained the importance of being compliant with PAP treatment, not only for insurance purposes but primarily to improve Her symptoms, and for the patient's long term health benefit, including to reduce Her cardiovascular risks. I answered all her questions today and the patient was in agreement. I plan to see her back after the sleep study is completed and encouraged her to call with any interim questions, concerns, problems or updates.  Thank you very much for allowing me to participate in the care of this nice patient. If I can be of any further assistance to you please do not hesitate to call me at [phone].  Sincerely,   Star Age, MD, PhD

## 2018-12-13 ENCOUNTER — Ambulatory Visit (INDEPENDENT_AMBULATORY_CARE_PROVIDER_SITE_OTHER): Payer: Managed Care, Other (non HMO) | Admitting: Neurology

## 2018-12-13 DIAGNOSIS — G4733 Obstructive sleep apnea (adult) (pediatric): Secondary | ICD-10-CM

## 2018-12-13 DIAGNOSIS — R0683 Snoring: Secondary | ICD-10-CM

## 2018-12-13 DIAGNOSIS — G4719 Other hypersomnia: Secondary | ICD-10-CM

## 2018-12-13 DIAGNOSIS — Z6841 Body Mass Index (BMI) 40.0 and over, adult: Secondary | ICD-10-CM

## 2018-12-13 DIAGNOSIS — R030 Elevated blood-pressure reading, without diagnosis of hypertension: Secondary | ICD-10-CM

## 2018-12-13 DIAGNOSIS — Z9189 Other specified personal risk factors, not elsewhere classified: Secondary | ICD-10-CM

## 2018-12-15 ENCOUNTER — Ambulatory Visit: Payer: Managed Care, Other (non HMO) | Admitting: Dietician

## 2018-12-16 ENCOUNTER — Telehealth: Payer: Self-pay

## 2018-12-16 NOTE — Addendum Note (Signed)
Addended by: [name] on: 12/16/2018 07:48 AM   Modules accepted: Orders

## 2018-12-16 NOTE — Telephone Encounter (Signed)
-----   Message from [name], MD sent at 12/16/2018  7:48 AM EST ----- Patient referred by Dr. Johna Sheriff, seen by me on 11/23/18, HST on 12/13/18.    Please call and notify the patient that the recent home sleep test showed obstructive sleep apnea. OSA is overall mild, but worth treating to see if she feels better after treatment, ie less sleepy and in light of her bariatric surgery plans, I recommend treatment in the form of autoPAP, which means, that we don't have to bring her in for a sleep study with CPAP, but will let her use an autoPAP machine at home, through a DME company (of her choice, or as per insurance requirement). The DME representative will educate her on how to use the machine, how to put the mask on, etc. I have placed an order in the chart. Please send referral, talk to patient, send report to referring MD. We will need a FU in sleep clinic for 10 weeks post-PAP set up, please arrange that with me or one of our NPs. Pls reinforce the importance of compliance. Thanks,   [name], MD, PhD [facility])

## 2018-12-16 NOTE — Procedures (Signed)
[facility] @[facility] [address] [place], [place] [address] NAME:  Virginia [name]                                                     DOB: 05-08-1976 MEDICAL RECORD no: [id number]                                                        DOS: 12/13/2018  REFERRING PHYSICIAN: Glenna Fellows, MD STUDY PERFORMED: Home Sleep Test on Watch [facility] HISTORY: 43 year old right-handed woman with a history of hypothyroidism and morbid obesity, who is being considered for weight loss surgery. Her Epworth sleepiness score is 14 out of 24.  STUDY RESULTS:    Total Recording Time: 6 hrs, 43 mins; Total Sleep Time:  5 hrs, 28 mins Total Apnea/Hypopnea Index (AHI): 10.6/h; RDI: 13.4 /h; REM AHI: 13.1/h Average Oxygen Saturation: 96%; Lowest Oxygen Desaturation:  91%  Total Time Oxygen Saturation Below or at 88 %: 0 minutes  Average Heart Rate: 80 bpm (between 59 and 106 bpm) IMPRESSION: OSA RECOMMENDATION: This home sleep test demonstrates overall mild obstructive sleep apnea with a total AHI of 10.6/hour and O2 nadir of 91%. Given the patient's sleep related complaints, especially daytime sleepiness, and plan for elective surgery for weight loss, I would recommend treatment with positive airway pressure in the form of autoPAP at home. Alternative treatments for OSA in general include weight loss along with avoidance of the supine sleep position, or an oral appliance in appropriate candidates or airway surgery. Please note that untreated obstructive sleep apnea may carry additional perioperative morbidity. Patients with significant obstructive sleep apnea should receive perioperative PAP therapy and the surgeons and particularly the anesthesiologist should be informed of the diagnosis and the severity of the sleep disordered breathing. The patient should be cautioned not to drive, work at heights, or operate dangerous or heavy equipment when tired or sleepy. Review and reiteration  of good sleep hygiene measures should be pursued with any patient. Other causes of the patient's symptoms, including circadian rhythm disturbances, an underlying mood disorder, medication effect and/or an underlying medical problem cannot be ruled out based on this test. Clinical correlation is recommended. The patient and his referring provider will be notified of the test results. The patient will be seen in follow up in sleep clinic at [facility].  I certify that I have reviewed the raw data recording prior to the issuance of this report in accordance with the standards of the American Academy of Sleep Medicine (AASM).  [name], MD, PhD [facility]) Diplomat, ABPN (Neurology and Sleep)

## 2018-12-16 NOTE — Telephone Encounter (Signed)
I called pt to discuss her sleep study results. No answer, VM is full. Will try again later. 

## 2018-12-16 NOTE — Progress Notes (Signed)
Patient referred by Dr. Johna Sheriff, seen by me on 11/23/18, HST on 12/13/18.    Please call and notify the patient that the recent home sleep test showed obstructive sleep apnea. OSA is overall mild, but worth treating to see if she feels better after treatment, ie less sleepy and in light of her bariatric surgery plans, I recommend treatment in the form of autoPAP, which means, that we don't have to bring her in for a sleep study with CPAP, but will let her use an autoPAP machine at home, through a DME company (of her choice, or as per insurance requirement). The DME representative will educate her on how to use the machine, how to put the mask on, etc. I have placed an order in the chart. Please send referral, talk to patient, send report to referring MD. We will need a FU in sleep clinic for 10 weeks post-PAP set up, please arrange that with me or one of our NPs. Pls reinforce the importance of compliance. Thanks,   [name], MD, PhD [facility])

## 2018-12-21 NOTE — Telephone Encounter (Signed)
I called pt. I advised pt that Dr. Frances Furbish reviewed their sleep study results and found that pt has mild osa. Dr. Frances Furbish recommends that pt start an auto pap, especially in light of her bariatric surgery plans. I reviewed PAP compliance expectations with the pt. Pt is agreeable to starting an auto-PAP. I advised pt that an order will be sent to a DME, [facility], and Christoper Allegra will call the pt within about one week after they file with the pt's insurance. Christoper Allegra will show the pt how to use the machine, fit for masks, and troubleshoot the auto-PAP if needed. A follow up appt was made for insurance purposes with Dr. Frances Furbish on 03/23/2019 at 8:30am. Pt verbalized understanding to arrive 15 minutes early and bring their auto-PAP. A letter with all of this information in it will be mailed to the pt as a reminder. I verified with the pt that the address we have on file is correct. Pt verbalized understanding of results. Pt had no questions at this time but was encouraged to call back if questions arise. I have sent the order to [facility] and have received confirmation that they have received the order.

## 2018-12-21 NOTE — Telephone Encounter (Signed)
I called pt again to discuss her sleep study results. VM not available. Will try again later.

## 2018-12-21 NOTE — Telephone Encounter (Signed)
Pt returned call. RN not available. Please call back as soon as available. °

## 2018-12-31 ENCOUNTER — Encounter: Payer: Self-pay | Admitting: Dietician

## 2018-12-31 ENCOUNTER — Encounter: Payer: Managed Care, Other (non HMO) | Attending: General Surgery | Admitting: Dietician

## 2018-12-31 VITALS — Wt 256.0 lb

## 2018-12-31 DIAGNOSIS — Z6841 Body Mass Index (BMI) 40.0 and over, adult: Secondary | ICD-10-CM | POA: Insufficient documentation

## 2018-12-31 DIAGNOSIS — E669 Obesity, unspecified: Secondary | ICD-10-CM

## 2018-12-31 DIAGNOSIS — Z713 Dietary counseling and surveillance: Secondary | ICD-10-CM | POA: Diagnosis not present

## 2018-12-31 NOTE — Patient Instructions (Signed)
   Start taking a multivitamin & vitamin D if you aren't already.   Continue working through [employer], keep up the great work!   See you next month for your next Supervised Visit!

## 2018-12-31 NOTE — Progress Notes (Signed)
Bariatric Supervised Weight Loss Visit Appt Start Time: 9:40am  End Time: 10:00am  Planned Surgery: Sleeve Gastrectomy   2nd out of 4 SWL Appointments   NUTRITION ASSESSMENT  Anthropometrics  Start weight at [facility]: 254 lbs (10/07/2018) Today's weight: 256 lbs Weight change: -2 lbs since previous visit on 11/12/2018 BMI: 41.32 kg/m2    Clinical  Medical Hx: obesity, sleep apnea (new) Medications: levothyroxine, phentermine, tylenol, colace, bactrim, valacyclovir Supplements: biotin   Psychosocial/Lifestyle Pt lives with her husband and 2 children. 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 Pt states she has a hx of nearly falling asleep at the wheel, which has since improved over the past few months since adding carbohydrates back into her diet. Pt has already been working on lifestyle changes prior to her nutrition assessment for surgery.   24-Hr Dietary Recall First Meal: Slim Fast shake (powder + almond milk) Snack: oatmeal  Second Meal: [employer] sandwich (on lettuce wrap or 9-grain bread)  Snack: none stated Third Meal: wheat pasta + meat (or chicken + vegetables)  Snack: none stated Beverages: water + soda/diet soda + sparkling water + hot tea + coffee w/ creamer   Food & Nutrition Related Hx Dietary Hx: Pt states she is not a big eater. Pt states she dislikes seafood, popcorn, and peanut butter. Pt states she loves Pepsi, but she tries to drink only 0-1 per day. Pt states she has added carbohydrates back in to her diet over the past few months since her assessment visit at [facility]. Pt states she has more energy and does not fall asleep at the wheel anymore.  Estimated Daily Fluid Intake: 64 oz GI / Other Notable Symptoms: none stated  Physical Activity  Current average weekly physical activity: walking (plans to start the gym, already has a membership)    Estimated Energy Needs Calories: 1800 Carbohydrate: 200g Protein: 135g Fat:  50g   NUTRITION DIAGNOSIS  Overweight/obesity (Hobson-3.3) related to past poor dietary habits and physical inactivity as evidenced by patient w/ planned Sleeve Gastrectomy surgery following dietary guidelines for continued weight loss.    NUTRITION INTERVENTION  Nutrition counseling (C-1) and education (E-2) to facilitate bariatric surgery goals.  New Pre-Op Goals to Work On . Working on Secretary/administrator.  . Still working on eliminating carbonated/caffeinated beverages (especially Pepsi.)  . Drinking average of 64 oz/day, purchased a 64 oz water bottle with timers to track intake.  . Tracking food and beverage intake.   Handouts Provided Include   Vitamins & Minerals    Learning Style & Readiness for Change Teaching method utilized: Visual & Auditory  Demonstrated degree of understanding via: Teach Back  Barriers to learning/adherence to lifestyle change: None Identified   MONITORING & EVALUATION Dietary intake, weekly physical activity, body weight, and pre-op goals.   Next Steps  Notes for next SWL Visit:   Assess progress made on Pre-Op Goals previously set to work on.   Identify 1-2 more Pre-Op Goals to begin working on.   Patient is to return to [facility] in 1 month for 3rd SWL Visit.

## 2019-02-03 NOTE — Telephone Encounter (Signed)
Pt does not appear to have been set up on auto pap. I called Christoper Allegra, spoke with Crystal. She reports that Christoper Allegra called her multiple times and pt has not called her back.  I called pt to discuss. No answer, left a message asking her to call me back.

## 2019-02-09 ENCOUNTER — Ambulatory Visit: Payer: Managed Care, Other (non HMO) | Admitting: Dietician

## 2019-02-23 ENCOUNTER — Encounter: Payer: Managed Care, Other (non HMO) | Attending: General Surgery | Admitting: Dietician

## 2019-02-23 ENCOUNTER — Encounter: Payer: Self-pay | Admitting: Dietician

## 2019-02-23 ENCOUNTER — Other Ambulatory Visit: Payer: Self-pay

## 2019-02-23 VITALS — Wt 262.0 lb

## 2019-02-23 DIAGNOSIS — Z713 Dietary counseling and surveillance: Secondary | ICD-10-CM | POA: Insufficient documentation

## 2019-02-23 DIAGNOSIS — Z6841 Body Mass Index (BMI) 40.0 and over, adult: Secondary | ICD-10-CM | POA: Diagnosis not present

## 2019-02-23 DIAGNOSIS — E669 Obesity, unspecified: Secondary | ICD-10-CM

## 2019-02-23 NOTE — Patient Instructions (Addendum)
Find something you can do for self care at home. You care a lot for others, be sure to care for yourself too!   Find some good go-to snack options that you enjoy. Have these on hand in the house to snack on when you are hungry between meals.   Look for another beverage option to help decrease soda intake. Try sugar-free flavorings (such as Crystal Light or Nuun) to mix with water, flavor water with fruit/lemon, Gatorade Zero, diet green tea, etc. Remember the goal is 64 ounces of fluid per day, at least half being plain water.   See you next month!

## 2019-02-23 NOTE — Progress Notes (Signed)
Bariatric Supervised Weight Loss Visit Appt Start Time: 4:35pm  End Time: 5:05pm  Planned Surgery: Sleeve Gastrectomy   3rd out of 4 SWL Appointments   NUTRITION ASSESSMENT  Anthropometrics  Start weight at [facility]: 254 lbs (10/07/2018) Today's weight: 262 lbs Weight change: +6 lbs since previous visit on 12/31/2018 BMI: 42.3 kg/m2    Clinical  Medical Hx: obesity, sleep apnea Medications: levothyroxine, phentermine, tylenol, colace, bactrim, valacyclovir Supplements: biotin   Psychosocial/Lifestyle Pt lives with her husband and 2 children. 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 Pt states she has a hx of nearly falling asleep at the wheel, which has since improved over the past few months since adding carbohydrates back into her diet. Pt has already been working on lifestyle changes prior to her nutrition assessment for surgery.  Throughout the past few weeks, pt has been working at home during the coronavirus pandemic. Pt states it has been difficult to find motivation for things (such as not eating out of boredom and doing at-home workouts) and has experienced stress with her husband and 2 children also being at home all the time.   24-Hr Dietary Recall First Meal: cheerios + milk + banana Snack: coffee  Second Meal: 2 eggs + avocado + strawberries + blueberries   Snack: none stated Third Meal: [place] food   Snack: none stated Beverages: water + soda/diet soda + sparkling water + hot tea + coffee w/ creamer   Food & Nutrition Related Hx Dietary Hx: Pt states she is not a big eater. Pt states she dislikes seafood, popcorn, and peanut butter. Pt states she loves Pepsi, but she tries to drink only 0-1 per day. Pt states she has added carbohydrates back in to her diet over the past few months since her assessment visit at [facility]. Pt states she has more energy and does not fall asleep at the wheel anymore.  Pt has been drinking Pepsi more since being  quarantined at home. Additionally, pt states she is trying to cut carbohydrates again but still including them occasionally throughout the week. Pt states she is very conscious of what she eats/drinks, but lacks motivation to make better choices.  Estimated Daily Fluid Intake: 64 oz Supplements: One a Day Women, vitamin D  GI / Other Notable Symptoms: none stated  Physical Activity  Current average weekly physical activity: walking (gym closed dt COVID-19, will walk in the neighborhood if her husband walks with her)    Estimated Energy Needs Calories: 1800 Carbohydrate: 200g Protein: 135g Fat: 50g   NUTRITION DIAGNOSIS  Overweight/obesity ([place]-3.3) related to past poor dietary habits and physical inactivity as evidenced by patient w/ planned Sleeve Gastrectomy surgery following dietary guidelines for continued weight loss.    NUTRITION INTERVENTION  Nutrition counseling (C-1) and education (E-2) to facilitate bariatric surgery goals.  New Pre-Op Goals to Work On . Working on Secretary/administrator.  . Still working on eliminating carbonated/caffeinated beverages (especially Pepsi.)  . Drinking average of 64 oz/day, purchased a 64 oz water bottle with timers to track intake.  . Tracking water intake.  . Taking MVI and D3.   Handouts Provided Include   Balanced Snacks + Bariatric Snack Ideas     Learning Style & Readiness for Change Teaching method utilized: Visual & Auditory  Demonstrated degree of understanding via: Teach Back  Barriers to learning/adherence to lifestyle change: None Identified   MONITORING & EVALUATION Dietary intake, weekly physical activity, body  weight, and pre-op goals.   Next Steps  Notes for next SWL Visit:   Assess progress made on Pre-Op Goals previously set to work on.   Identify 1-2 more Pre-Op Goals to begin working on.   Patient is to return to [facility] in 1 month for 4th SWL Visit.

## 2019-03-16 NOTE — Telephone Encounter (Signed)
I called pt and discussed this with her. She will discuss with her bariatric surgeon the need for auto pap. She will not pursue auto pap if her surgeon does not require it. She will pursue weight loss and an oral appliance. She will call us back if she decides to pursue auto pap. Pt verbalized understanding of recommendations.

## 2019-03-16 NOTE — Telephone Encounter (Signed)
Per sleep study results on 12/13/18: "Total Apnea/Hypopnea Index (AHI): 10.6/h; RDI: 13.4 /h; REM AHI: 13.1/h Average Oxygen Saturation: 96%; Lowest Oxygen Desaturation:  91%  Total Time Oxygen Saturation Below or at 88 %: 0 minutes"

## 2019-03-16 NOTE — Telephone Encounter (Signed)
Her sleep apnea is indeed mild, I suggested treatment because of her plan for bariatric surgery and to see if she would feel better.  Treatment options include weight loss, and I am not sure where she stands with her surgical plans for weight loss.  We could consider an oral appliance but I would actually wait it out if she has ongoing plans to pursue weight loss medically or with surgery. autoPAP or CPAP therapy is not warranted in her case but was an offer for a treatment option that we could pursue. pls reassure pt.

## 2019-03-16 NOTE — Telephone Encounter (Signed)
I called patient this morning regarding her 5/27 initial cpap follow-up. Patient states that she has still not been set up on her cpap and does not plan to be any time soon due to COVID. She is hesitant about the cost of a cpap machine, and also hesitant about the need. She states that she was told that her sleep apnea is minor. I advised her that I would send this info to nurse. She requests a call back to get clarity on the direct need for a cpap. If unable to reach a voicemail is ok. I have cancelled this appt and advised patient to call back if she gets cpap set up.

## 2019-03-16 NOTE — Telephone Encounter (Signed)
I called pt to discuss. No answer, left a message asking her to call me back. 

## 2019-03-23 ENCOUNTER — Ambulatory Visit: Payer: Managed Care, Other (non HMO) | Admitting: Dietician

## 2019-03-23 ENCOUNTER — Ambulatory Visit: Payer: Self-pay | Admitting: Neurology

## 2019-04-07 ENCOUNTER — Other Ambulatory Visit: Payer: Self-pay

## 2019-04-07 ENCOUNTER — Encounter: Payer: Self-pay | Admitting: Dietician

## 2019-04-07 ENCOUNTER — Encounter: Payer: Managed Care, Other (non HMO) | Attending: General Surgery | Admitting: Dietician

## 2019-04-07 DIAGNOSIS — E669 Obesity, unspecified: Secondary | ICD-10-CM

## 2019-04-07 DIAGNOSIS — Z6841 Body Mass Index (BMI) 40.0 and over, adult: Secondary | ICD-10-CM | POA: Insufficient documentation

## 2019-04-07 DIAGNOSIS — Z713 Dietary counseling and surveillance: Secondary | ICD-10-CM | POA: Diagnosis present

## 2019-04-07 NOTE — Patient Instructions (Addendum)
Review the Pre-Op Goals sheet again and make sure you are reminded/ aware of these goals to help prepare you for surgery.   Great progress so far! You've come a long way and are ready.  See you at Pre-Op Class 2 weeks before your surgery!

## 2019-04-07 NOTE — Progress Notes (Signed)
Bariatric Supervised Weight Loss Visit Appt Start Time: 10:35pm  End Time: 10:50am  Planned Surgery: Sleeve Gastrectomy   4th out of 4 SWL Appointments   NUTRITION ASSESSMENT  Anthropometrics  Start weight at [facility]: 254 lbs (10/07/2018) Today's weight: 257.6 lbs Weight change: -4.4 lbs since previous visit on 02/23/2019 BMI: 41.58 kg/m2    Clinical  Medical Hx: obesity, sleep apnea Medications: levothyroxine, phentermine, tylenol, colace, bactrim, valacyclovir Supplements: biotin   Psychosocial/Lifestyle Pt lives with her husband and 2 children. 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 Pt states she has a hx of nearly falling asleep at the wheel, which has since improved over the past few months since adding carbohydrates back into her diet. Pt has already been working on lifestyle changes prior to her nutrition assessment for surgery.  Throughout the past few weeks, pt has been working at home during the coronavirus pandemic. Pt states it has been difficult to find motivation for things (such as not eating out of boredom and doing at-home workouts) and has experienced stress with her husband and 2 children also being at home all the time.   24-Hr Dietary Recall First Meal: eggs + sauteed spinach (or protein shake)  Snack: coffee  Second Meal: 2 eggs + avocado + strawberries + blueberries (or protein shake + fruit)  Snack: none stated Third Meal: sometimes skips   Snack: none stated Beverages: water + sparkling water + hot tea + coffee w/ sugar-free creamer   Food & Nutrition Related Hx Dietary Hx: Pt states she is not a big eater. She has not felt like eating much since it has gotten hotter outside.  Estimated Daily Fluid Intake: 64 oz Supplements: One a Day Women, vitamin D  GI / Other Notable Symptoms: none stated  Physical Activity  Current average weekly physical activity: walking (gym closed dt COVID-19, will walk in the neighborhood if  her husband walks with her)    Estimated Energy Needs Calories: 1800 Carbohydrate: 200g Protein: 135g Fat: 50g   NUTRITION DIAGNOSIS  Overweight/obesity ([place]-3.3) related to past poor dietary habits and physical inactivity as evidenced by patient w/ planned Sleeve Gastrectomy surgery following dietary guidelines for continued weight loss.    NUTRITION INTERVENTION  Nutrition counseling (C-1) and education (E-2) to facilitate bariatric surgery goals.  New Pre-Op Goals to Work On . Working on Passenger transport manager.  . Eliminated carbonated/caffeinated beverages (especially Pepsi) since June 1st  . Drinking average of 64 oz/day, purchased a 64 oz water bottle with timers to track intake.  . Taking MVI and D3.   Handouts Provided Include   Pre-Op Goals  Bariatric Vitamins & Minerals      Learning Style & Readiness for Change Teaching method utilized: Visual & Auditory  Demonstrated degree of understanding via: Teach Back  Barriers to learning/adherence to lifestyle change: None Identified   MONITORING & EVALUATION Dietary intake, weekly physical activity, body weight, and pre-op goals.   Next Steps  Patient is to contact [facility] to schedule Pre-Op and Post-Op Class dates once surgery is scheduled.

## 2019-09-07 ENCOUNTER — Other Ambulatory Visit: Payer: Self-pay

## 2019-09-07 DIAGNOSIS — Z20822 Contact with and (suspected) exposure to covid-19: Secondary | ICD-10-CM

## 2019-09-10 LAB — NOVEL CORONAVIRUS, NAA: SARS-CoV-2, NAA: NOT DETECTED

## 2019-09-18 ENCOUNTER — Encounter (HOSPITAL_BASED_OUTPATIENT_CLINIC_OR_DEPARTMENT_OTHER): Payer: Self-pay | Admitting: Emergency Medicine

## 2019-09-18 ENCOUNTER — Other Ambulatory Visit: Payer: Self-pay

## 2019-09-18 ENCOUNTER — Emergency Department (HOSPITAL_BASED_OUTPATIENT_CLINIC_OR_DEPARTMENT_OTHER)
Admission: EM | Admit: 2019-09-18 | Discharge: 2019-09-18 | Disposition: A | Discharge status: Home / Self Care | Payer: Managed Care, Other (non HMO) | Attending: Emergency Medicine | Admitting: Emergency Medicine

## 2019-09-18 DIAGNOSIS — R070 Pain in throat: Secondary | ICD-10-CM | POA: Diagnosis present

## 2019-09-18 DIAGNOSIS — R05 Cough: Secondary | ICD-10-CM | POA: Insufficient documentation

## 2019-09-18 DIAGNOSIS — Z79899 Other long term (current) drug therapy: Secondary | ICD-10-CM | POA: Diagnosis not present

## 2019-09-18 DIAGNOSIS — J029 Acute pharyngitis, unspecified: Secondary | ICD-10-CM | POA: Insufficient documentation

## 2019-09-18 DIAGNOSIS — M7918 Myalgia, other site: Secondary | ICD-10-CM | POA: Insufficient documentation

## 2019-09-18 DIAGNOSIS — J069 Acute upper respiratory infection, unspecified: Secondary | ICD-10-CM | POA: Diagnosis not present

## 2019-09-18 LAB — GROUP A STREP BY PCR: Group A Strep by PCR: NOT DETECTED

## 2019-09-18 MED ORDER — LIDOCAINE VISCOUS HCL 2 % MT SOLN: 15.0000 mL | OROMUCOSAL | 0 refills | Status: DC | PRN

## 2019-09-18 MED ORDER — AEROCHAMBER PLUS FLO-VU MEDIUM MISC
1.0000 | Freq: Once | Status: AC
  Administered 2019-09-18: 1
  Filled 2019-09-18: qty 1

## 2019-09-18 MED ORDER — ALBUTEROL SULFATE HFA 108 (90 BASE) MCG/ACT IN AERS
1.0000 | INHALATION_SPRAY | Freq: Once | RESPIRATORY_TRACT | Status: AC
  Administered 2019-09-18: 1 via RESPIRATORY_TRACT
  Filled 2019-09-18: qty 6.7

## 2019-09-18 NOTE — ED Provider Notes (Signed)
[facility] EMERGENCY DEPARTMENT Provider Note   CSN: [id number] Arrival date & time: 09/18/19  1607     History   Chief Complaint Chief Complaint  Patient presents with  . Sore Throat    HPI Virginia [name] is a 43 y.o. female who presents to ED with a chief complaint of cough and sore throat.  For the past 4 days been having dry cough as well as generalized body aches, sore throat and chills.  No sick contacts with similar symptoms, her daughter was diagnosed with mononucleosis 3 weeks ago had significantly improved.  She denies any known COVID-19 exposures.  She has not tried any medications to help with her symptoms.  Denies any trouble breathing or trouble swallowing, chest pain, vomiting, trismus.     HPI  Past Medical History:  Diagnosis Date  . Fatigue   . Headaches due to old head injury   . Neck pain   . Sleep difficulties     Patient Active Problem List   Diagnosis Date Noted  . Hypertriglyceridemia 05/24/2014  . Obesity 05/21/2014  . Elevated ALT measurement 05/21/2014  .  Histoyr of Essure hysteroscopic sterilization 2007 05/21/2014    Past Surgical History:  Procedure Laterality Date  . ESSURE TUBAL LIGATION  20007     OB History    Gravida  1   Para      Term      Preterm      AB      Living  2     SAB      TAB      Ectopic      Multiple      Live Births               Home Medications    Prior to Admission medications   Medication Sig Start Date End Date Taking? Authorizing Provider  acetaminophen (TYLENOL) 500 MG tablet Take 1,000 mg by mouth every 6 (six) hours as needed.    [provider]  levothyroxine (SYNTHROID, LEVOTHROID) 25 MCG tablet Take 25 mcg by mouth daily.    [provider]  lidocaine (XYLOCAINE) 2 % solution Use as directed 15 mLs in the mouth or throat as needed for mouth pain. 09/18/19   Cletus Mehlhoff, PA-C  phentermine 37.5 MG capsule Take 37.5 mg by mouth every  morning.    [provider]    Family History No family history on file.  Social History Social History   Tobacco Use  . Smoking status: Never Smoker  . Smokeless tobacco: Never Used  Substance Use Topics  . Alcohol use: No    Alcohol/week: 0.0 standard drinks  . Drug use: No     Allergies   Patient has no known allergies.   Review of Systems Review of Systems  Constitutional: Positive for chills. Negative for fever.  HENT: Positive for sore throat.   Respiratory: Positive for cough.   Musculoskeletal: Positive for myalgias.     Physical Exam Updated Vital Signs BP 118/62 (BP Location: Left Arm)   Pulse 90   Temp 99.8 F (37.7 C) (Oral)   Resp 18   Ht 5\' 6"  (1.676 m)   Wt 114.3 kg   LMP 09/05/2019   SpO2 100%   BMI 40.67 kg/m   Physical Exam Vitals signs and nursing note reviewed.  Constitutional:      General: She is not in acute distress.    Appearance: She is well-developed. She is not  diaphoretic.     Comments: Speaking in complete sentences without difficulty.  HENT:     Head: Normocephalic and atraumatic.     Mouth/Throat:     Pharynx: Oropharynx is clear. Posterior oropharyngeal erythema present.     Tonsils: No tonsillar exudate. 1+ on the right. 1+ on the left.     Comments: Patient does not appear to be in acute distress. No trismus or drooling present. No pooling of secretions. Patient is tolerating secretions and is not in respiratory distress. No neck pain or tenderness to palpation of the neck. Full active and passive range of motion of the neck. No evidence of RPA or PTA. Eyes:     General: No scleral icterus.    Conjunctiva/sclera: Conjunctivae normal.  Neck:     Musculoskeletal: Normal range of motion.  Cardiovascular:     Rate and Rhythm: Normal rate and regular rhythm.     Heart sounds: Normal heart sounds.  Pulmonary:     Effort: Pulmonary effort is normal. No respiratory distress.     Breath sounds: Wheezing (bilateral  lung bases) present.  Skin:    Findings: No rash.  Neurological:     Mental Status: She is alert.      ED Treatments / Results  Labs (all labs ordered are listed, but only abnormal results are displayed) Labs Reviewed  GROUP A STREP BY PCR    EKG None  Radiology No results found.  Procedures Procedures (including critical care time)  Medications Ordered in ED Medications  albuterol (VENTOLIN HFA) 108 (90 Base) MCG/ACT inhaler 1 puff (has no administration in time range)  AeroChamber Plus Flo-Vu Medium MISC 1 each (has no administration in time range)     Initial Impression / Assessment and Plan / ED Course  I have reviewed the triage vital signs and the nursing notes.  Pertinent labs & imaging results that were available during my care of the patient were reviewed by me and considered in my medical decision making (see chart for details).        Virginia [name] was evaluated in Emergency Department on 09/18/19 for the symptoms described in the history of present illness. He/she was evaluated in the context of the global COVID-19 pandemic, which necessitated consideration that the patient might be at risk for infection with the SARS-CoV-2 virus that causes COVID-19. Institutional protocols and algorithms that pertain to the evaluation of patients at risk for COVID-19 are in a state of rapid change based on information released by regulatory bodies including the CDC and federal and state organizations. These policies and algorithms were followed during the patient's care in the ED.  Pt rapid strep test negative. Pt is tolerating secretions, not in respiratory distress, no neck pain, no trismus. Presentation not concerning for peritonsillar abscess or spread of infection to deep spaces of the throat; patent airway. Pt will be discharged with lidocaine to swish and spit. Ibuprofen or Tylenol as needed for pain/fever. Albuterol to help with chest tightness. Specific  return precautions discussed. Recommended PCP follow up. Pt appears safe for discharge.   Patient is hemodynamically stable, in NAD, and able to ambulate in the ED. Evaluation does not show pathology that would require ongoing emergent intervention or inpatient treatment. I explained the diagnosis to the patient. Pain has been managed and has no complaints prior to discharge. Patient is comfortable with above plan and is stable for discharge at this time. All questions were answered prior to disposition. Strict return precautions for returning  to the ED were discussed. Encouraged follow up with PCP.   An After Visit Summary was printed and given to the patient.   Portions of this note were generated with [employer]. Dictation errors may occur despite best attempts at proofreading.  Final Clinical Impressions(s) / ED Diagnoses   Final diagnoses:  Viral pharyngitis  Viral URI with cough    ED Discharge Orders         Ordered    lidocaine (XYLOCAINE) 2 % solution  As needed     09/18/19 1750           Delia Heady, PA-C 09/18/19 1754    Lajean Saver, MD 09/19/19 1324

## 2019-09-18 NOTE — ED Triage Notes (Signed)
Sore throat and body aches since Wed.

## 2019-09-18 NOTE — ED Notes (Signed)
RespRichardson Landry C in with Pt. At present time

## 2019-09-18 NOTE — Discharge Instructions (Addendum)
Return to the ED if you start to experience worsening symptoms, chest pain, shortness of breath, trouble opening your mouth, trouble swallowing.

## 2019-09-30 ENCOUNTER — Encounter: Payer: Self-pay | Admitting: Family Medicine

## 2019-09-30 ENCOUNTER — Other Ambulatory Visit: Payer: Self-pay

## 2019-09-30 ENCOUNTER — Ambulatory Visit (INDEPENDENT_AMBULATORY_CARE_PROVIDER_SITE_OTHER): Payer: Managed Care, Other (non HMO) | Admitting: Family Medicine

## 2019-09-30 VITALS — BP 98/62 | HR 101 | Temp 97.3°F | Resp 18 | Ht 66.0 in | Wt 254.6 lb

## 2019-09-30 DIAGNOSIS — R058 Other specified cough: Secondary | ICD-10-CM

## 2019-09-30 DIAGNOSIS — R05 Cough: Secondary | ICD-10-CM

## 2019-09-30 NOTE — Progress Notes (Signed)
Chief Complaint  Patient presents with  . Establish Care  . Urinary Tract Infection       New Patient Visit SUBJECTIVE: HPI: Virginia [name] is an 43 y.o.female who is being seen for establishing care.  The patient was previously seen at her GYN's office only.  Patient was seen in the emergency department around 2 weeks ago for an upper respiratory infection.  She still having some tightness in her chest and intermittent cough.  She was prescribed albuterol which was slightly helpful.  She is wondering if she needs to continue to take this.  She does not have any wheezing or shortness of breath.  Denies any other upper respiratory symptoms.  She is not currently having a fever.  The patient has a history of obesity.  She is currently prescribed phentermine by her gynecologist.  She was originally going to get bariatric surgery, however this was derailed once the pandemic started.  Her diet is fair.  She does not exercise very often but does try to walk.  She is searching for a walking partner.  Her goal weight is 180 pounds.  She started at just over 300 pounds.  Past Medical History:  Diagnosis Date  . Fatigue   . Headaches due to old head injury   . Neck pain   . Sleep difficulties    Past Surgical History:  Procedure Laterality Date  . ESSURE TUBAL LIGATION  20007   History reviewed. No pertinent family history. No Known Allergies  Current Outpatient Medications:  .  acetaminophen (TYLENOL) 500 MG tablet, Take 1,000 mg by mouth every 6 (six) hours as needed., Disp: , Rfl:  .  levothyroxine (SYNTHROID, LEVOTHROID) 25 MCG tablet, Take 25 mcg by mouth daily., Disp: , Rfl:  .  phentermine 37.5 MG capsule, Take 37.5 mg by mouth every morning., Disp: , Rfl:   Patient's last menstrual period was 09/14/2019 (exact date).  ROS Const: Denies fevers    Respiratory: Denies dyspnea   OBJECTIVE: BP 98/62 (BP Location: Left Arm, Patient Position: Sitting, Cuff Size: Normal)    Pulse (!) 101   Temp (!) 97.3 F (36.3 C) (Temporal)   Resp 18   Ht 5\' 6"  (1.676 m)   Wt 254 lb 9.6 oz (115.5 kg)   LMP 09/14/2019 (Exact Date)   SpO2 98%   BMI 41.09 kg/m  General:  well developed, well nourished, in no apparent distress Skin:  no significant moles, warts, or growths Head:  no masses, lesions, or tenderness Eyes:  pupils equal and round, sclera anicteric without injection Ears:  canals without lesions, TMs shiny without retraction, no obvious effusion, no erythema Nose:  nares patent, septum midline, mucosa normal Neck: neck supple without adenopathy, thyromegaly, or masses Lungs:  clear to auscultation, breath sounds equal bilaterally, no respiratory distress Cardio:  regular rate and rhythm, no LE edema or bruits Rectal: Deferred Musculoskeletal:  symmetrical muscle groups noted without atrophy or deformity Extremities:  no clubbing, cyanosis, or edema, no deformities, no skin discoloration Neuro:  gait normal Psych: well oriented with normal range of affect and appropriate judgment/insight  ASSESSMENT/PLAN: Post-viral cough syndrome  Morbid obesity (HCC)  1- Cont SABA prn.  Counseled on the limited nature of this issue. 2-counseled on diet and exercise.  Goal weight at next visit in 6 months will be between 230 and 240 pounds.  Healthy diet handout given. Patient should return in 6 mo for CPE or prn. The patient voiced understanding and agreement to the plan.  Jilda Roche [place], DO 09/30/19  3:34 PM

## 2019-09-30 NOTE — Patient Instructions (Addendum)
This can last for several weeks after the infection, but sounds like it is getting better.  Use the albuterol inhaler as needed.  Keep the diet clean and stay active.  Aim to do some physical exertion for 150 minutes per week. This is typically divided into 5 days per week, 30 minutes per day. The activity should be enough to get your heart rate up. Anything is better than nothing if you have time constraints.  Goal weight for 6 mo: 230-240 lbs.   Let us know if you need anything.  Healthy Eating Plan Many factors influence your heart health, including eating and exercise habits. Heart (coronary) risk increases with abnormal blood fat (lipid) levels. Heart-healthy meal planning includes limiting unhealthy fats, increasing healthy fats, and making other small dietary changes. This includes maintaining a healthy body weight to help keep lipid levels within a normal range.  WHAT IS MY PLAN?  Your health care provider recommends that you:  Drink a glass of water before meals to help with satiety.  Eat slowly.  An alternative to the water is to add Metamucil. This will help with satiety as well. It does contain calories, unlike water.  WHAT TYPES OF FAT SHOULD I CHOOSE?  Choose healthy fats more often. Choose monounsaturated and polyunsaturated fats, such as olive oil and canola oil, flaxseeds, walnuts, almonds, and seeds.  Eat more omega-3 fats. Good choices include salmon, mackerel, sardines, tuna, flaxseed oil, and ground flaxseeds. Aim to eat fish at least two times each week.  Avoid foods with partially hydrogenated oils in them. These contain trans fats. Examples of foods that contain trans fats are stick margarine, some tub margarines, cookies, crackers, and other baked goods. If you are going to avoid a fat, this is the one to avoid!  WHAT GENERAL GUIDELINES DO I NEED TO FOLLOW?  Check food labels carefully to identify foods with trans fats. Avoid these types of options when  possible.  Fill one half of your plate with vegetables and green salads. Eat 4-5 servings of vegetables per day. A serving of vegetables equals 1 cup of raw leafy vegetables,  cup of raw or cooked cut-up vegetables, or  cup of vegetable juice.  Fill one fourth of your plate with whole grains. Look for the word "whole" as the first word in the ingredient list.  Fill one fourth of your plate with lean protein foods.  Eat 4-5 servings of fruit per day. A serving of fruit equals one medium whole fruit,  cup of dried fruit,  cup of fresh, frozen, or canned fruit. Try to avoid fruits in cups/syrups as the sugar content can be high.  Eat more foods that contain soluble fiber. Examples of foods that contain this type of fiber are apples, broccoli, carrots, beans, peas, and barley. Aim to get 20-30 g of fiber per day.  Eat more home-cooked food and less restaurant, buffet, and fast food.  Limit or avoid alcohol.  Limit foods that are high in starch and sugar.  Avoid fried foods when able.  Cook foods by using methods other than frying. Baking, boiling, grilling, and broiling are all great options. Other fat-reducing suggestions include: ? Removing the skin from poultry. ? Removing all visible fats from meats. ? Skimming the fat off of stews, soups, and gravies before serving them. ? Steaming vegetables in water or broth.  Lose weight if you are overweight. Losing just 5-10% of your initial body weight can help your overall health and prevent diseases  such as diabetes and heart disease.  Increase your consumption of nuts, legumes, and seeds to 4-5 servings per week. One serving of dried beans or legumes equals  cup after being cooked, one serving of nuts equals 1 ounces, and one serving of seeds equals  ounce or 1 tablespoon.  WHAT ARE GOOD FOODS CAN I EAT? Grains Grainy breads (try to find bread that is 3 g of fiber per slice or greater), oatmeal, light popcorn. Whole-grain cereals.  Rice and pasta, including brown rice and those that are made with whole wheat. Edamame pasta is a great alternative to grain pasta. It has a higher protein content. Try to avoid significant consumption of white bread, sugary cereals, or pastries/baked goods.  Vegetables All vegetables. Cooked white potatoes do not count as vegetables.  Fruits All fruits, but limit pineapple and bananas as these fruits have a higher sugar content.  Meats and Other Protein Sources Lean, well-trimmed beef, veal, pork, and lamb. Chicken and [place] without skin. All fish and shellfish. Wild duck, rabbit, pheasant, and venison. Egg whites or low-cholesterol egg substitutes. Dried beans, peas, lentils, and tofu.Seeds and most nuts.  Dairy Low-fat or nonfat cheeses, including ricotta, string, and mozzarella. Skim or 1% milk that is liquid, powdered, or evaporated. Buttermilk that is made with low-fat milk. Nonfat or low-fat yogurt. Soy/Almond milk are good alternatives if you cannot handle dairy.  Beverages Water is the best for you. Sports drinks with less sugar are more desirable unless you are a highly active athlete.  Sweets and Desserts Sherbets and fruit ices. Honey, jam, marmalade, jelly, and syrups. Dark chocolate.  Eat all sweets and desserts in moderation.  Fats and Oils Nonhydrogenated (trans-free) margarines. Vegetable oils, including soybean, sesame, sunflower, olive, peanut, safflower, corn, canola, and cottonseed. Salad dressings or mayonnaise that are made with a vegetable oil. Limit added fats and oils that you use for cooking, baking, salads, and as spreads.  Other Cocoa powder. Coffee and tea. Most condiments.  The items listed above may not be a complete list of recommended foods or beverages. Contact your dietitian for more options.

## 2019-10-25 ENCOUNTER — Ambulatory Visit: Payer: Managed Care, Other (non HMO) | Attending: Internal Medicine

## 2019-10-25 DIAGNOSIS — Z20822 Contact with and (suspected) exposure to covid-19: Secondary | ICD-10-CM

## 2019-10-26 LAB — NOVEL CORONAVIRUS, NAA: SARS-CoV-2, NAA: NOT DETECTED

## 2019-11-15 ENCOUNTER — Other Ambulatory Visit: Payer: Self-pay | Admitting: Obstetrics & Gynecology

## 2019-11-15 DIAGNOSIS — N6489 Other specified disorders of breast: Secondary | ICD-10-CM

## 2019-11-28 ENCOUNTER — Ambulatory Visit
Admission: RE | Admit: 2019-11-28 | Discharge: 2019-11-28 | Disposition: A | Discharge status: Home / Self Care | Payer: Managed Care, Other (non HMO) | Source: Ambulatory Visit | Attending: Obstetrics & Gynecology | Admitting: Obstetrics & Gynecology

## 2019-11-28 ENCOUNTER — Other Ambulatory Visit: Payer: Self-pay

## 2019-11-28 ENCOUNTER — Ambulatory Visit: Payer: Managed Care, Other (non HMO)

## 2019-11-28 DIAGNOSIS — N6489 Other specified disorders of breast: Secondary | ICD-10-CM

## 2020-01-06 ENCOUNTER — Ambulatory Visit: Payer: Managed Care, Other (non HMO) | Attending: Internal Medicine

## 2020-01-06 DIAGNOSIS — Z20822 Contact with and (suspected) exposure to covid-19: Secondary | ICD-10-CM

## 2020-01-07 LAB — NOVEL CORONAVIRUS, NAA: SARS-CoV-2, NAA: NOT DETECTED

## 2020-02-04 ENCOUNTER — Ambulatory Visit: Payer: Managed Care, Other (non HMO) | Attending: Internal Medicine

## 2020-02-04 DIAGNOSIS — Z23 Encounter for immunization: Secondary | ICD-10-CM

## 2020-02-04 NOTE — Progress Notes (Signed)
   Covid-19 Vaccination Clinic  Name:  Virginia [name]    MRN: [id number] DOB: 16-Oct-1976  02/04/2020  [name] was observed post Covid-19 immunization for 15 minutes without incident. She was provided with Vaccine Information Sheet and instruction to access the V-Safe system.   [name] was instructed to call 911 with any severe reactions post vaccine: Marland Kitchen Difficulty breathing  . Swelling of face and throat  . A fast heartbeat  . A bad rash all over body  . Dizziness and weakness   Immunizations Administered    Name Date Dose VIS Date Route   Pfizer COVID-19 Vaccine 02/04/2020  8:27 AM 0.3 mL 10/07/2019 Intramuscular   Manufacturer: [employer], [employer]   Lot: [id number]   NDC: [address]-9

## 2020-02-28 ENCOUNTER — Ambulatory Visit: Payer: Managed Care, Other (non HMO) | Attending: Internal Medicine

## 2020-02-28 DIAGNOSIS — Z23 Encounter for immunization: Secondary | ICD-10-CM

## 2020-02-28 NOTE — Progress Notes (Signed)
   Covid-19 Vaccination Clinic  Name:  Virginia [name]    MRN: [id number] DOB: 1976-03-10  02/28/2020  [name] was observed post Covid-19 immunization for 15 minutes without incident. She was provided with Vaccine Information Sheet and instruction to access the V-Safe system.   [name] was instructed to call 911 with any severe reactions post vaccine: Marland Kitchen Difficulty breathing  . Swelling of face and throat  . A fast heartbeat  . A bad rash all over body  . Dizziness and weakness   Immunizations Administered    Name Date Dose VIS Date Route   Pfizer COVID-19 Vaccine 02/28/2020  8:35 AM 0.3 mL 12/21/2018 Intramuscular   Manufacturer: [employer], [employer]   Lot: [id number]   NDC: [address]-1

## 2020-03-30 ENCOUNTER — Encounter: Payer: Managed Care, Other (non HMO) | Admitting: Family Medicine

## 2020-06-19 ENCOUNTER — Other Ambulatory Visit: Payer: Self-pay

## 2020-06-19 ENCOUNTER — Other Ambulatory Visit: Payer: Managed Care, Other (non HMO)

## 2020-06-19 ENCOUNTER — Other Ambulatory Visit: Payer: Self-pay | Admitting: *Deleted

## 2020-06-19 DIAGNOSIS — Z20822 Contact with and (suspected) exposure to covid-19: Secondary | ICD-10-CM

## 2020-06-21 LAB — SARS-COV-2, NAA 2 DAY TAT

## 2020-06-21 LAB — NOVEL CORONAVIRUS, NAA: SARS-CoV-2, NAA: NOT DETECTED

## 2021-01-07 ENCOUNTER — Other Ambulatory Visit: Payer: Self-pay | Admitting: Obstetrics & Gynecology

## 2021-01-07 DIAGNOSIS — N6489 Other specified disorders of breast: Secondary | ICD-10-CM

## 2021-04-03 ENCOUNTER — Other Ambulatory Visit: Payer: Self-pay

## 2021-04-03 ENCOUNTER — Ambulatory Visit
Admission: RE | Admit: 2021-04-03 | Discharge: 2021-04-03 | Disposition: A | Discharge status: Home / Self Care | Payer: 59 | Source: Ambulatory Visit | Attending: Obstetrics & Gynecology | Admitting: Obstetrics & Gynecology

## 2021-04-03 DIAGNOSIS — N6489 Other specified disorders of breast: Secondary | ICD-10-CM

## 2021-04-03 LAB — HM MAMMOGRAPHY

## 2021-04-08 ENCOUNTER — Encounter: Payer: Self-pay | Admitting: Family Medicine

## 2021-10-06 ENCOUNTER — Emergency Department (HOSPITAL_BASED_OUTPATIENT_CLINIC_OR_DEPARTMENT_OTHER): Payer: 59

## 2021-10-06 ENCOUNTER — Encounter (HOSPITAL_BASED_OUTPATIENT_CLINIC_OR_DEPARTMENT_OTHER): Payer: Self-pay | Admitting: Emergency Medicine

## 2021-10-06 ENCOUNTER — Emergency Department (HOSPITAL_BASED_OUTPATIENT_CLINIC_OR_DEPARTMENT_OTHER)
Admission: EM | Admit: 2021-10-06 | Discharge: 2021-10-07 | Disposition: A | Discharge status: Home / Self Care | Payer: 59 | Attending: Emergency Medicine | Admitting: Emergency Medicine

## 2021-10-06 ENCOUNTER — Other Ambulatory Visit: Payer: Self-pay

## 2021-10-06 DIAGNOSIS — U071 COVID-19: Secondary | ICD-10-CM | POA: Diagnosis not present

## 2021-10-06 DIAGNOSIS — R509 Fever, unspecified: Secondary | ICD-10-CM | POA: Diagnosis present

## 2021-10-06 MED ORDER — BENZONATATE 100 MG PO CAPS: 100.0000 mg | ORAL_CAPSULE | Freq: Three times a day (TID) | ORAL | 0 refills | Status: DC

## 2021-10-06 MED ORDER — ALBUTEROL SULFATE HFA 108 (90 BASE) MCG/ACT IN AERS: 2.0000 | INHALATION_SPRAY | Freq: Four times a day (QID) | RESPIRATORY_TRACT | 0 refills | Status: DC | PRN

## 2021-10-06 NOTE — ED Provider Notes (Signed)
[facility] EMERGENCY DEPARTMENT Provider Note   CSN: [id number] Arrival date & time: 10/06/21  2112     History Chief Complaint  Patient presents with   Covid Positive    Virginia [name] is a 45 y.o. female with medical history of bronchitis. Patient reports that on Thursday night she began experiencing fevers, shortness of breath, sore throat, cough, sneezing. Patient reports that her daughter recently went out of town for her birthday and became sick. Patient states that she tested herself Wednesday with negative results but when she became symptomatic on Thursday retested herself which resulted positive. Patient denies nausea, vomiting, diarrhea. Patient states she presented to ED because she called her PCP to get an appointment and reported that she had blue lips. Patient PCP instructed her to present to ED. Patient does not have signs of cyanosis on examination.   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 No pertinent family history.  Social History   Tobacco Use   Smoking status: Never    Passive exposure: Never   Smokeless tobacco: Never  Vaping Use   Vaping Use: Never used  Substance Use Topics   Alcohol use: No    Alcohol/week: 0.0 standard drinks   Drug use: No    Home Medications Prior to Admission medications   Medication Sig Start Date End Date  Taking? Authorizing Provider  albuterol (VENTOLIN HFA) 108 (90 Base) MCG/ACT inhaler Inhale 2 puffs into the lungs every 6 (six) hours as needed for wheezing or shortness of breath. 10/06/21  Yes Al Decant, PA-C  benzonatate (TESSALON) 100 MG capsule Take 1 capsule (100 mg total) by mouth every 8 (eight) hours. 10/06/21  Yes Al Decant, PA-C  acetaminophen (TYLENOL) 500 MG tablet Take 1,000 mg by mouth every 6 (six) hours as needed.    [provider]  levothyroxine (SYNTHROID, LEVOTHROID) 25 MCG tablet Take 25 mcg by mouth daily.    [provider]  phentermine 37.5 MG capsule Take 37.5 mg by mouth every morning.    [provider]    Allergies    Patient has no known allergies.  Review of Systems   Review of Systems  Constitutional:  Negative for fever.  HENT:  Positive for congestion, sneezing and sore throat.   Respiratory:  Positive for cough and shortness of breath.   Cardiovascular:  Negative for chest pain.  Gastrointestinal:  Negative for abdominal pain, diarrhea, nausea and vomiting.  Neurological:  Negative for dizziness, weakness and light-headedness.  All other  systems reviewed and are negative.  Physical Exam Updated Vital Signs BP 110/80 (BP Location: Right Arm)   Pulse 80   Temp 98.2 F (36.8 C) (Oral)   Resp 18   Ht 5' 6.5" (1.689 m)   Wt 111.1 kg   LMP 09/27/2021 Comment: tubal ligation  SpO2 100%   BMI 38.95 kg/m   Physical Exam Vitals and nursing note reviewed.  Constitutional:      General: She is not in acute distress.    Appearance: She is not toxic-appearing.  HENT:     Head: Normocephalic.     Nose: Nose normal.     Mouth/Throat:     Mouth: Mucous membranes are moist.  Eyes:     Pupils: Pupils are equal, round, and reactive to light.  Cardiovascular:     Rate and Rhythm: Normal rate and regular rhythm.  Pulmonary:     Effort: Pulmonary effort is normal.     Breath sounds: Normal breath sounds.  No stridor. No wheezing.  Chest:     Chest wall: Tenderness present.  Abdominal:     General: Abdomen is flat.     Palpations: Abdomen is soft.     Tenderness: There is no abdominal tenderness.  Musculoskeletal:     Cervical back: Normal range of motion.  Skin:    General: Skin is warm and dry.     Capillary Refill: Capillary refill takes less than 2 seconds.  Neurological:     General: No focal deficit present.     Mental Status: She is alert.    ED Results / Procedures / Treatments   Labs (all labs ordered are listed, but only abnormal results are displayed) Labs Reviewed - No data to display  EKG EKG Interpretation  Date/Time:  Sunday October 06 2021 21:56:56 EST Ventricular Rate:  82 PR Interval:  160 QRS Duration: 76 QT Interval:  360 QTC Calculation: 420 R Axis:   72 Text Interpretation: Normal sinus rhythm Normal ECG Confirmed by Long, Joshua ([id number]) on 10/06/2021 10:23:18 PM  Radiology DG Chest 2 View  Result Date: 10/06/2021 CLINICAL DATA:  Cough, shortness of breath, EXAM: CHEST - 2 VIEW COMPARISON:  None. FINDINGS: The heart size and mediastinal contours are within normal limits. Both lungs are clear. The visualized skeletal structures are unremarkable. IMPRESSION: Normal study. Electronically Signed   By: Kevin  Dover M.D.   On: 10/06/2021 22:03    Procedures Procedures   Medications Ordered in ED Medications - No data to display  ED Course  I have reviewed the triage vital signs and the nursing notes.  Pertinent labs & imaging results that were available during my care of the patient were reviewed by me and considered in my medical decision making (see chart for details).    MDM Rules/Calculators/A&P                          45  year old female presents for evaluation and management of COVID-positive results.  On examination, patient is nonhypoxic, afebrile, speaking full sentences, handling secretions appropriately with clear lung sounds  bilaterally.  Patient does not have any underlying lung disease or risk factors that would qualify her for outpatient antiviral Paxlovid  treatment.  Patient chest x-ray does not reveal any cardiopulmonary disease, infiltrates, signs of pneumonia.  I will prescribe this patient an albuterol inhaler for her shortness of breath and Tessalon Perles for cough.  I feels patient is appropriate for discharge at this time.  I have explained to this patient her discharge instructions such as shortness of breath, blue or purple lips, excessively high fever.  This patient expresses understanding of the discharge instructions.  I explained to the patient that she will feel ill for the next 7 to 10 days but that is expected with COVID.  I explained to her that she needs to isolate her self for 5 days and wear masks when she is around others.  She expressed understanding of these instructions.  I have discussed this patient and her case and presentation with Dr. Jacqulyn Bath.  He is in agreement with the current plan for outpatient treatment.  Final Clinical Impression(s) / ED Diagnoses Final diagnoses:  COVID-19    Rx / DC Orders ED Discharge Orders          Ordered    albuterol (VENTOLIN HFA) 108 (90 Base) MCG/ACT inhaler  Every 6 hours PRN        10/06/21 2358    benzonatate (TESSALON) 100 MG capsule  Every 8 hours        10/06/21 2358             Al Decant, PA-C 10/06/21 2359    Maia Plan, MD 10/10/21 [phone]

## 2021-10-06 NOTE — Discharge Instructions (Addendum)
Return to ED with any new or worsening symptoms such as shortness of breath, poor peripheral lips, fever unresponsive to medication, excessive vomiting You need to isolate yourself for the next 5 days wear masks around daily members and others Utilize your albuterol inhaler that I prescribed for you for shortness of breath.  Take 2 breaths and hold it for as long as you can and then breathe out [employer] for any ongoing cough Treat fevers with Tylenol Treat body aches with ibuprofen

## 2021-10-06 NOTE — ED Triage Notes (Signed)
Reports tested positive for covid yesterday.  Symptoms started on Thursday.  Having  cough, body aches, sob.  Hx of bronchitis thought she may need an inhaler.

## 2021-10-07 ENCOUNTER — Telehealth: Payer: Self-pay

## 2021-10-07 NOTE — Telephone Encounter (Signed)
Pt evaluated/treated in ER.   [facility] Night - ClientTELEPHONE ADVICE RECORDAccessNurse Patient Name:Virginia [name] Initial Comment Caller states, she states has been tested positive for Covid-19. She states is having difficulty breathing and slightly chest pain. Translation No Nurse Assessment Nurse: Pollyann Savoy, RN, Melissa Date/Time (Eastern Time): 10/06/2021 8:26:35 PM Confirm and document reason for call. If symptomatic, describe symptoms. ---Caller states, she states has been tested positive for Covid-19(pos. home test yesterday). She states is having difficulty breathing and slightly chest pain-sxs started yesterday and worsened today. Has cough,hoarseness, and runny nose-no fever-highest fever 100.9 on Fri-no fever since Fri-OtherSxs started late night Thur. Does the patient have any new or worsening symptoms? ---Yes Will a triage be completed? ---Yes Related visit to physician within the last 2 weeks? ---No Does the PT have any chronic conditions? (i.e. diabetes, asthma, this includes High risk factors for pregnancy, etc.) ---No Is the patient pregnant or possibly pregnant? (Ask all females between the ages of 68-55) ---No Is this a behavioral health or substance abuse call? ---No Guidelines Guideline Title Affirmed Question Affirmed Notes Nurse Date/Time (Eastern Time) COVID-19 - Diagnosed or Suspected Bluish (or gray) lips or face now [place], RN, Melissa 10/06/2021 8:29:00 PM PLEASE NOTE: All timestamps contained within this report are represented as [place] Standard Time. CONFIDENTIALTY NOTICE: This fax transmission is intended only for the addressee. It contains information that is legally privileged, confidential or otherwise protected from use or disclosure. If you are not the intended recipient, you are strictly prohibited from reviewing, disclosing, copying using or disseminating any of this information or taking any action in  reliance on or regarding this information. If you have received this fax in error, please notify us immediately by telephone so that we can arrange for its return to [place]. Phone: [phone], Toll-Free: [phone], Fax: [phone] Page: 2 of 2 Call Id: [id number] Disp. Time Lamount Cohen Time) Disposition Final User 10/06/2021 8:22:58 PM Send to Urgent Queue Derrill Kay 10/06/2021 8:36:54 PM Called On-Call Provider Zayas, RN, Melissa Reason: Pt refuses 911-uncertain where she is going. 10/06/2021 8:36:14 PM Call EMS 911 Now Yes Zayas, RN, Efraim Kaufmann Caller Disagree/Comply Disagree Caller Understands Yes PreDisposition InappropriateToAsk Care Advice Given Per Guideline CALL EMS 911 NOW: * Immediate medical attention is needed. You need to hang up and call 911 (or an ambulance). * Triager Discretion: I'll call you back in a few minutes to be sure you were able to reach them. CARE ADVICE given per COVID-19 - DIAGNOSED OR SUSPECTED (Adult) guideline. Referrals REFERRED TO PCP OFFICE GO TO FACILITY UNDECIDED

## 2021-11-20 IMAGING — MG DIGITAL DIAGNOSTIC BILAT W/ TOMO W/ CAD
8 of 14 series · 8 of 40 positions shown · non-contrast
Comparison: Previous exam(s).

CLINICAL DATA: 45-year-old female presenting for annual bilateral
mammogram and final follow-up of a probably benign left breast
asymmetry.

EXAM:
DIGITAL DIAGNOSTIC BILATERAL MAMMOGRAM WITH TOMOSYNTHESIS AND CAD
TECHNIQUE: Bilateral digital diagnostic mammography and breast tomosynthesis
was performed. The images were evaluated with computer-aided
detection.

[R CC synth-2D]
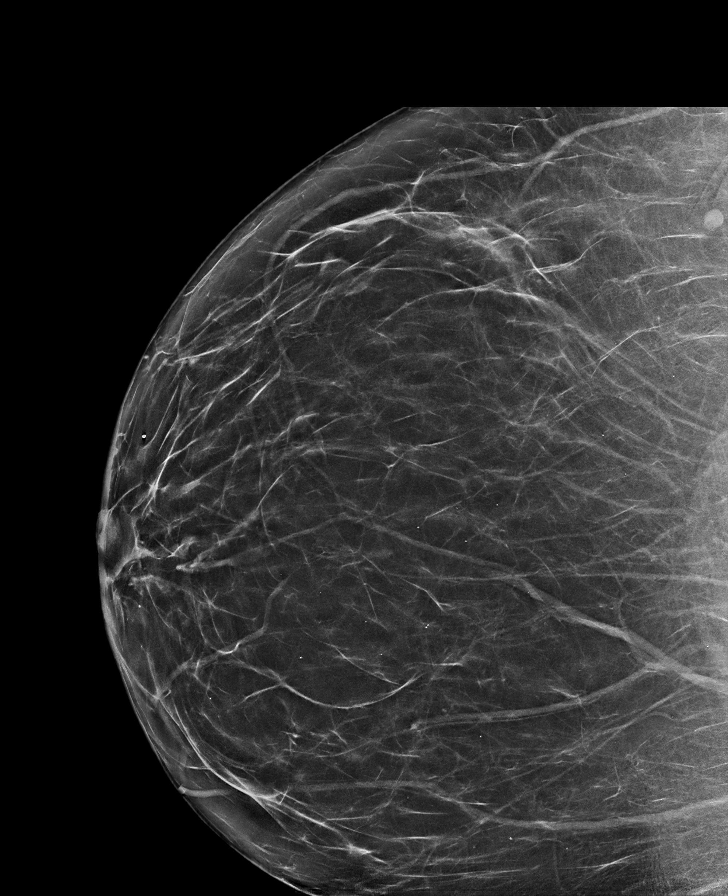

[L CC synth-2D]
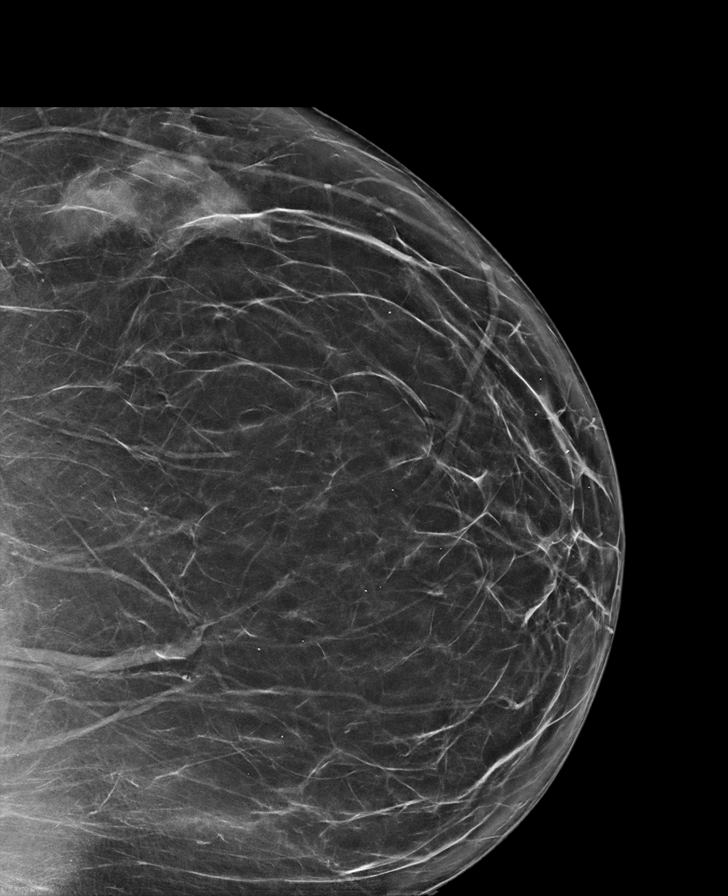

[R MLO synth-2D (1 of 2)]
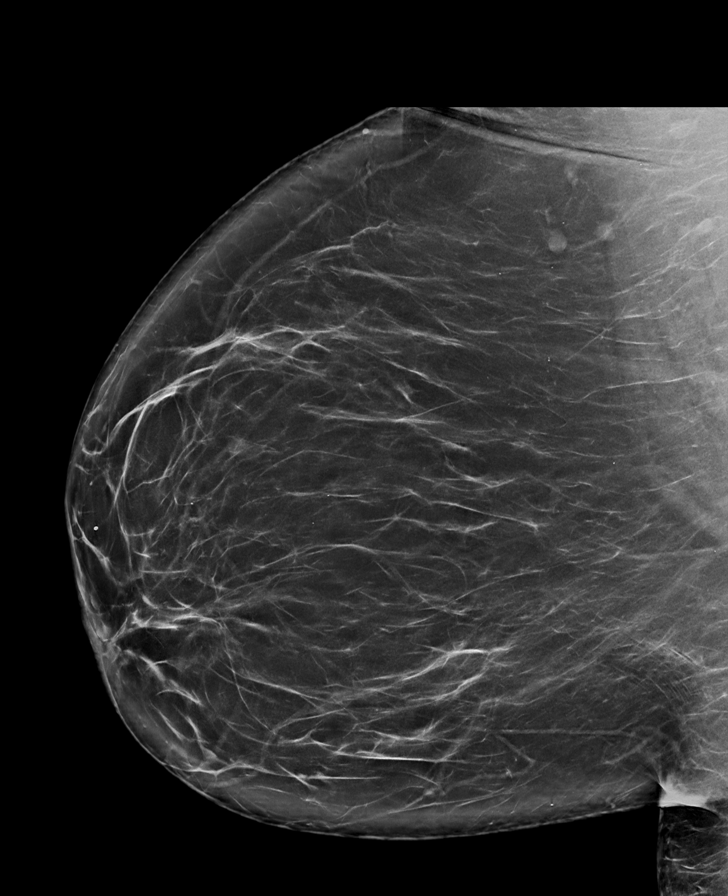

[R CV synth-2D]
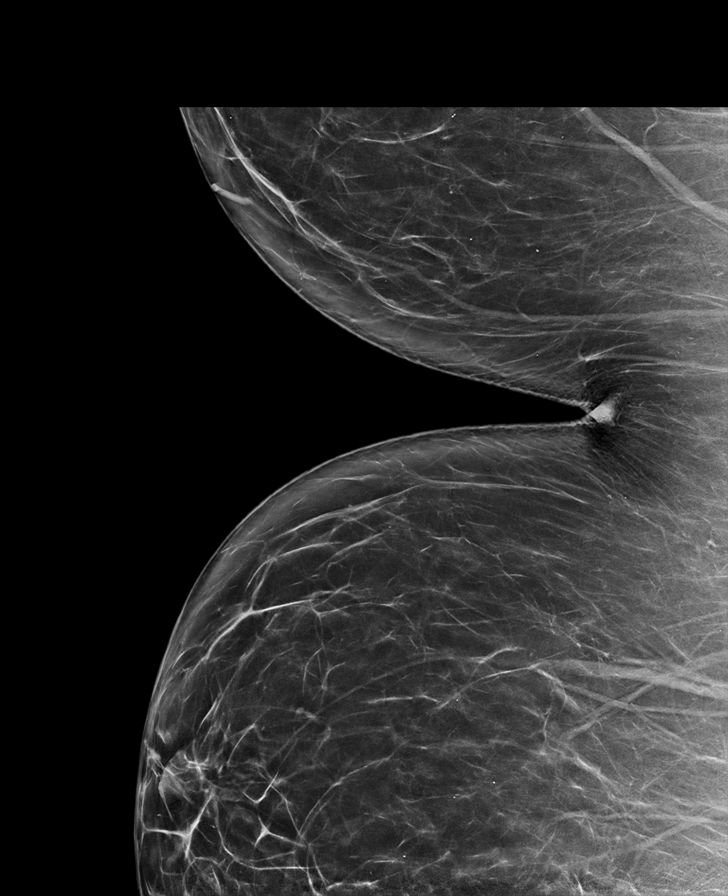

[L MLO synth-2D (1 of 2)]
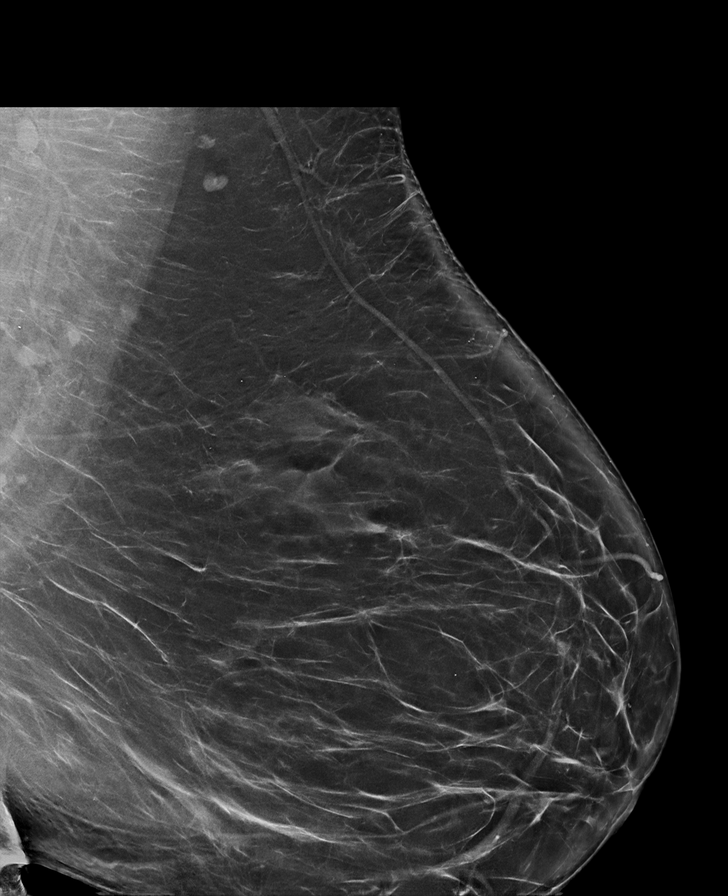

[L MLO synth-2D (2 of 2)]
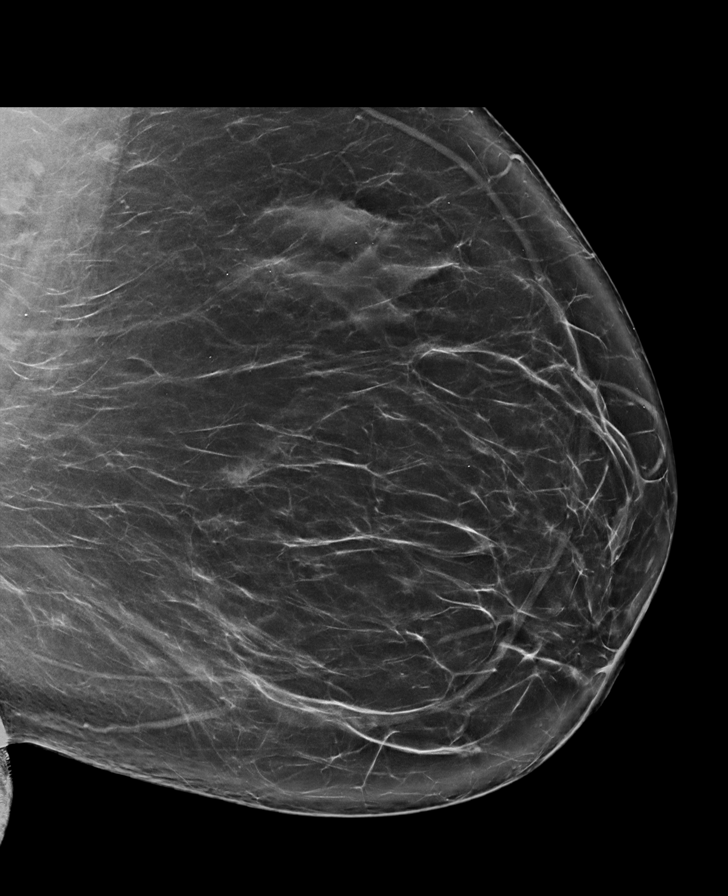

[R MLO synth-2D (2 of 2)]
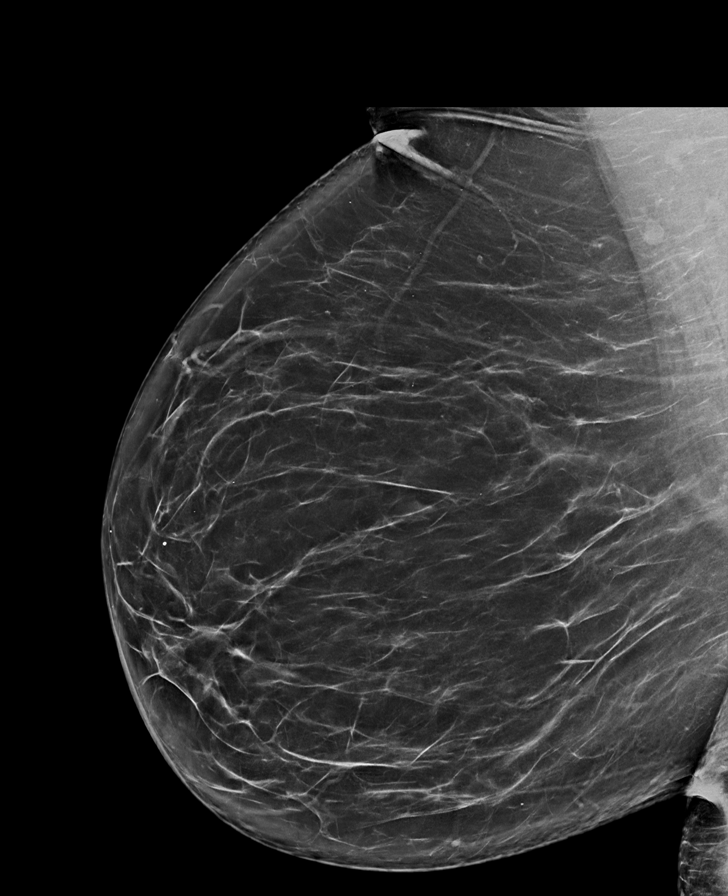

[L CC tomo · tomo slice 41/81.0]
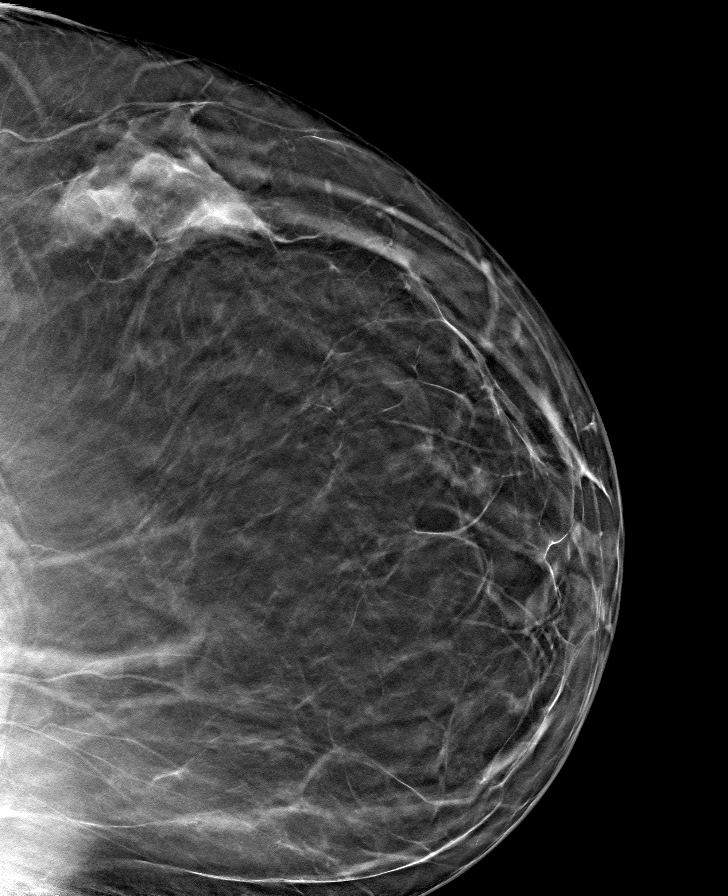

[8 of 40 positions shown; findings below may reference images not displayed]

ACR Breast Density Category b: There are scattered areas of
fibroglandular density.
FINDINGS: Focal asymmetry in the upper outer left breast is mammographically
stable dating back to 7834. No new or suspicious findings in either
breast. The parenchymal pattern is stable.
IMPRESSION: 1. No mammographic evidence of malignancy in either breast.
2. Benign left breast focal asymmetry demonstrating greater than 2
year stability. No further imaging follow-up required.

RECOMMENDATION:
Screening mammogram in one year.(Code:UA-6-HT3)

I have discussed the findings and recommendations with the patient.
If applicable, a reminder letter will be sent to the patient
regarding the next appointment.

BI-RADS CATEGORY  2: Benign.

## 2022-08-11 LAB — HM MAMMOGRAPHY

## 2022-08-13 ENCOUNTER — Encounter: Payer: Self-pay | Admitting: Family Medicine

## 2022-08-13 ENCOUNTER — Other Ambulatory Visit: Payer: Self-pay | Admitting: Obstetrics & Gynecology

## 2022-08-13 DIAGNOSIS — R928 Other abnormal and inconclusive findings on diagnostic imaging of breast: Secondary | ICD-10-CM

## 2022-08-27 ENCOUNTER — Other Ambulatory Visit (HOSPITAL_COMMUNITY): Payer: Self-pay | Admitting: General Surgery

## 2022-09-05 ENCOUNTER — Ambulatory Visit: Payer: 59 | Admitting: Dietician

## 2022-09-09 ENCOUNTER — Ambulatory Visit
Admission: RE | Admit: 2022-09-09 | Discharge: 2022-09-09 | Disposition: A | Discharge status: Home / Self Care | Payer: 59 | Source: Ambulatory Visit | Attending: Obstetrics & Gynecology | Admitting: Obstetrics & Gynecology

## 2022-09-09 ENCOUNTER — Other Ambulatory Visit: Payer: Self-pay | Admitting: Obstetrics & Gynecology

## 2022-09-09 ENCOUNTER — Ambulatory Visit: Payer: 59

## 2022-09-09 DIAGNOSIS — R928 Other abnormal and inconclusive findings on diagnostic imaging of breast: Secondary | ICD-10-CM

## 2022-09-10 ENCOUNTER — Ambulatory Visit (HOSPITAL_COMMUNITY)
Admission: RE | Admit: 2022-09-10 | Discharge: 2022-09-10 | Disposition: A | Discharge status: Home / Self Care | Payer: 59 | Source: Ambulatory Visit | Attending: General Surgery | Admitting: General Surgery

## 2022-09-10 ENCOUNTER — Other Ambulatory Visit: Payer: Self-pay

## 2022-09-10 DIAGNOSIS — E669 Obesity, unspecified: Secondary | ICD-10-CM | POA: Insufficient documentation

## 2022-09-11 ENCOUNTER — Encounter: Payer: 59 | Attending: Family Medicine | Admitting: Dietician

## 2022-09-11 ENCOUNTER — Encounter: Payer: Self-pay | Admitting: Dietician

## 2022-09-11 VITALS — Ht 66.5 in | Wt 243.7 lb

## 2022-09-11 DIAGNOSIS — E669 Obesity, unspecified: Secondary | ICD-10-CM | POA: Insufficient documentation

## 2022-09-11 NOTE — Progress Notes (Signed)
Nutrition Assessment for Bariatric Surgery Medical Nutrition Therapy Appt Start Time: 8:42    End Time: 9:46  Patient was seen on 09/11/2022 for Pre-Operative Nutrition Assessment. Letter of approval faxed to [facility] bariatric surgery program coordinator on 09/11/2022.   Referral stated Supervised Weight Loss (SWL) visits needed: 3 months  Planned surgery: Sleeve Gastrectomy   NUTRITION ASSESSMENT   Anthropometrics  Start weight at [facility]: 243.7 lbs (date: 09/11/2022)  Height: 66.5 in BMI: 38.74 kg/m2     Clinical  Medical hx: Sleep Apnea Medications: Oxybutynin, phentermine  Labs: Vit D 19.1 Notable signs/symptoms: none noted Any previous deficiencies? No  Micronutrient Nutrition Focused Physical Exam: Hair: No issues observed Eyes: No issues observed Mouth: No issues observed Neck: No issues observed Nails: No issues observed Skin: No issues observed  Lifestyle & Dietary Hx  Patient lives with husband and son. The pt's husband performs the food shopping and the pt prepares the meals. She reports that she typically skips or misses 14 out of 21 possible meals per week. She may have 3 meals per week that are take-out or at a restaurant.  Patient works as Pharmacist, community. She denies binge eating though has felt shame and/or guilt after eating too much food.  She denies having used laxatives or vomiting to facilitate weight loss. She admits to emotional eating during times of boredom. She states that she knows the difference between hunger and thirst and can tell when she is full. Pt states she was diagnosed with sleep apnea in 2019, stating she never went to get a C-PAP machine.  Physical Activity: Gym, 2-3 days a week, or walking or light workout at home  Sleep Hygiene: duration and quality: 5.5-6, waking up some during the night.  Current Patient Perceived Stress Level as stated by pt on a scale of 1-10: 9.5      Stress Management Techniques:  walking  Fruit servings per week: 2-3 Non starchy vegetable servings per week: 3 Whole Grains per week: 2   24-Hr Dietary Recall First Meal: coffee Snack: protein shake and banana Second Meal: skip or order out (Tuesday, Wednesday, Thursday) Snack:  Third Meal: skip Snack: sometimes a cup of coffee Beverages: coffee, seltzer water, water  Alcoholic beverages per week: 0   Estimated Energy Needs Calories: 1500  NUTRITION DIAGNOSIS  Overweight/obesity ([place]-3.3) related to past poor dietary habits and physical inactivity as evidenced by patient w/ planned sleeve surgery following dietary guidelines for continued weight loss.    NUTRITION INTERVENTION  Nutrition counseling (C-1) and education (E-2) to facilitate bariatric surgery goals.  Educated pt on micronutrient deficiencies post surgery and strategies to mitigate that risk   Pre-Op Goals Reviewed with the Patient Track food and beverage intake (pen and paper, MyFitness Pal, Baritastic app, etc.) Make healthy food choices while monitoring portion sizes Consume 3 meals per day or try to eat every 3-5 hours Avoid concentrated sugars and fried foods Keep sugar & fat in the single digits per serving on food labels Practice CHEWING your food (aim for applesauce consistency) Practice not drinking 15 minutes before, during, and 30 minutes after each meal and snack Avoid all carbonated beverages (ex: soda, sparkling beverages)  Limit caffeinated beverages (ex: coffee, tea, energy drinks) Avoid all sugar-sweetened beverages (ex: regular soda, sports drinks)  Avoid alcohol  Aim for 64-100 ounces of FLUID daily (with at least half of fluid intake being plain water)  Aim for at least 60-80 grams of PROTEIN daily Look for a  liquid protein source that contains ?15 g protein and ?5 g carbohydrate (ex: shakes, drinks, shots) Make a list of non-food related activities Physical activity is an important part of a healthy lifestyle so keep  it moving! The goal is to reach 150 minutes of exercise per week, including cardiovascular and weight baring activity.  *Goals that are bolded indicate the pt would like to start working towards these  Handouts Provided Include  Bariatric Surgery handouts (Nutrition Visits, Pre-Op Goals, Protein Shakes, Vitamins & Minerals)  Learning Style & Readiness for Change Teaching method utilized: Visual & Auditory  Demonstrated degree of understanding via: Teach Back  Readiness Level: contemplative Barriers to learning/adherence to lifestyle change: infrequency of meals  RD's Notes for Next Visit Progress toward patient's chosen goals   MONITORING & EVALUATION Dietary intake, weekly physical activity, body weight, and pre-op goals reached at next nutrition visit.    Next Steps  Patient is to follow up at [facility] for Pre-Op Class >2 weeks before surgery for further nutrition education.

## 2022-09-24 ENCOUNTER — Ambulatory Visit
Admission: RE | Admit: 2022-09-24 | Discharge: 2022-09-24 | Disposition: A | Discharge status: Home / Self Care | Payer: 59 | Source: Ambulatory Visit | Attending: Obstetrics & Gynecology | Admitting: Obstetrics & Gynecology

## 2022-09-24 DIAGNOSIS — R928 Other abnormal and inconclusive findings on diagnostic imaging of breast: Secondary | ICD-10-CM

## 2022-09-24 HISTORY — PX: BREAST BIOPSY: SHX20

## 2022-10-10 ENCOUNTER — Ambulatory Visit: Payer: 59 | Admitting: Dietician

## 2022-10-13 ENCOUNTER — Encounter: Payer: Self-pay | Admitting: Dietician

## 2022-10-13 ENCOUNTER — Encounter: Payer: 59 | Attending: General Surgery | Admitting: Dietician

## 2022-10-13 VITALS — Ht 66.5 in | Wt 245.7 lb

## 2022-10-13 DIAGNOSIS — E669 Obesity, unspecified: Secondary | ICD-10-CM | POA: Insufficient documentation

## 2022-10-13 NOTE — Progress Notes (Signed)
Supervised Weight Loss Visit Bariatric Nutrition Education Appt Start Time: 8:35    End Time: 9:02  Planned surgery: Sleeve Gastrectomy  1 out of 3 SWL Appointments   NUTRITION ASSESSMENT  Anthropometrics  Start weight at [facility]: 243.7 lbs (date: 09/11/2022)  Height: 66.5 in Weight today: 245.7 BMI: 39.06 kg/m2     Clinical  Medical hx: Sleep Apnea Medications: Oxybutynin, phentermine  Labs: Vit D 19.1 Notable signs/symptoms: none noted Any previous deficiencies? No  Lifestyle & Dietary Hx  Pt states she went to [place], for a visit, stating she felt that she might have gained some weight. Pt states she slipped up on drinking soda, but stating she is more mindful, and is doing better. Pt states she focused on eating more protein and tracking, stating she is getting 45-60 grams of protein. Pt states she does not like to cook, stating her children are grown and don't live her anymore. Pt agreeable to using the meal ideas handout, stating it will help.  Estimated daily fluid intake: 48-60 oz Supplements: One a day for women; biotin Current average weekly physical activity: gym 2-3 days a week, gym this morning. [place]  24-Hr Dietary Recall First Meal: eggs with cheese, coffee Snack:  Second Meal: skip Snack:  Third Meal: macaroni and cheese Snack:  Beverages: water, coffee, ginger ale  Estimated Energy Needs Calories: 1500   NUTRITION DIAGNOSIS  Overweight/obesity ([place]-3.3) related to past poor dietary habits and physical inactivity as evidenced by patient w/ planned sleeve surgery following dietary guidelines for continued weight loss.   NUTRITION INTERVENTION  Nutrition counseling (C-1) and education (E-2) to facilitate bariatric surgery goals.  Why you need complex carbohydrates: Whole grains and other complex carbohydrates are required to have a healthy diet. Whole grains provide fiber which can help with blood glucose levels and help keep you  satiated. Fruits and starchy vegetables provide essential vitamins and minerals required for immune function, eyesight support, brain support, bone density, wound healing and many other functions within the body. According to the current evidenced based 2020-2025 Dietary Guidelines for Americans, complex carbohydrates are part of a healthy eating pattern which is associated with a decreased risk for type 2 diabetes, cancers, and cardiovascular disease.   Pre-Op Goals Reviewed with the Patient Track food and beverage intake (pen and paper, MyFitness Pal, Baritastic app, etc.) Make healthy food choices while monitoring portion sizes Consume 3 meals per day or try to eat every 3-5 hours Avoid concentrated sugars and fried foods Keep sugar & fat in the single digits per serving on food labels Practice CHEWING your food (aim for applesauce consistency) Practice not drinking 15 minutes before, during, and 30 minutes after each meal and snack Avoid all carbonated beverages (ex: soda, sparkling beverages)  Limit caffeinated beverages (ex: coffee, tea, energy drinks) Avoid all sugar-sweetened beverages (ex: regular soda, sports drinks)  Avoid alcohol  Aim for 64-100 ounces of FLUID daily (with at least half of fluid intake being plain water)  Aim for at least 60-80 grams of PROTEIN daily Look for a liquid protein source that contains ?15 g protein and ?5 g carbohydrate (ex: shakes, drinks, shots) Make a list of non-food related activities Physical activity is an important part of a healthy lifestyle so keep it moving! The goal is to reach 150 minutes of exercise per week, including cardiovascular and weight baring activity.  *Goals that are bolded indicate the pt would like to start working towards these  [place]  Goals Consume 3 meals per day or try to eat every 3-5 hours Avoid all carbonated beverages (ex: soda, sparkling beverages)  Aim for at least 60-80 grams of PROTEIN  daily  Handouts Provided Include  Meal Ideas Handout  Learning Style & Readiness for Change Teaching method utilized: Visual & Auditory  Demonstrated degree of understanding via: Teach Back  Readiness Level: contemplative Barriers to learning/adherence to lifestyle change:   RD's Notes for next Visit  Patient progress toward chosen goals.   MONITORING & EVALUATION Dietary intake, weekly physical activity, body weight, and pre-op goals in 1 month.   Next Steps  Patient is to return to [facility] in one month for next SWL visit.

## 2022-11-14 ENCOUNTER — Ambulatory Visit: Payer: 59 | Admitting: Dietician

## 2022-11-21 ENCOUNTER — Encounter: Payer: 59 | Attending: Family Medicine | Admitting: Dietician

## 2022-11-21 ENCOUNTER — Encounter: Payer: Self-pay | Admitting: Dietician

## 2022-11-21 VITALS — Ht 66.5 in | Wt 244.4 lb

## 2022-11-21 DIAGNOSIS — E669 Obesity, unspecified: Secondary | ICD-10-CM | POA: Diagnosis not present

## 2022-11-21 NOTE — Progress Notes (Addendum)
Supervised Weight Loss Visit Bariatric Nutrition Education Appt Start Time: 9:18   End Time: 9:45  Planned surgery: Sleeve Gastrectomy  2 out of 3 SWL Appointments   NUTRITION ASSESSMENT  Anthropometrics  Start weight at [facility]: 243.7 lbs (date: 09/11/2022)  Height: 66.5 in Weight today: 244.4 BMI: 38.86 kg/m2     Clinical  Medical hx: Sleep Apnea Medications: Oxybutynin, phentermine  Labs: Vit D 19.1 Notable signs/symptoms: none noted Any previous deficiencies? No  Lifestyle & Dietary Hx  Pt states she has been traveling, stating she has slacked off and hasn't been to the gym. Pt states she went to visit her sister who has had bariatric surgery, and did well while visiting her, but has not kept up with changes since being back home. Pt states she knows what she needs to do, stating she just needs to stick to it.  Estimated daily fluid intake: 32-48 oz Supplements: One a day for women; biotin Current average weekly physical activity: gym 3 days a week, gym this morning. [place], 3 miles on Saturday and Sunday each.  24-Hr Dietary Recall First Meal: eggs with cheese, coffee Snack:  Second Meal: skip Snack:  Third Meal: macaroni and cheese Snack:  Beverages: water, coffee, ginger ale  Estimated Energy Needs Calories: 1500  NUTRITION DIAGNOSIS  Overweight/obesity (-3.3) related to past poor dietary habits and physical inactivity as evidenced by patient w/ planned sleeve surgery following dietary guidelines for continued weight loss.  NUTRITION INTERVENTION  Nutrition counseling (C-1) and education (E-2) to facilitate bariatric surgery goals.  Why you need complex carbohydrates: Whole grains and other complex carbohydrates are required to have a healthy diet. Whole grains provide fiber which can help with blood glucose levels and help keep you satiated. Fruits and starchy vegetables provide essential vitamins and minerals required for immune function, eyesight  support, brain support, bone density, wound healing and many other functions within the body. According to the current evidenced based 2020-2025 Dietary Guidelines for Americans, complex carbohydrates are part of a healthy eating pattern which is associated with a decreased risk for type 2 diabetes, cancers, and cardiovascular disease.  Encouraged pt to continue to eat balanced meals inclusive of non starchy vegetables 2 times a day 7 days a week Encouraged pt to continue to drink a minium 64 fluid ounces with half being plain water to satisfy proper hydration    Pre-Op Goals Progress & New Goals Continue: Consume 3 meals per day or try to eat every 3-5 hours Continue: Aim for at least 64 oz of fluids  New: Track Protein; Aim for at least 60-80 grams daily New: Increase physical activity; gym 3 days a week; walking on weekend at the park New: Take OTC Vit D New: Practice not drinking with meals and snacks  Handouts Provided Include  My[employer] Handout Protein Foods List with amounts  Learning Style & Readiness for Change Teaching method utilized: Visual & Auditory  Demonstrated degree of understanding via: Teach Back  Readiness Level: contemplative Barriers to learning/adherence to lifestyle change: reverts back to old habits  RD's Notes for next Visit  Patient progress toward chosen goals.   MONITORING & EVALUATION Dietary intake, weekly physical activity, body weight, and pre-op goals in 1 month.   Next Steps  Patient is to return to [facility] in one month for next SWL visit.

## 2022-12-19 ENCOUNTER — Ambulatory Visit: Payer: 59 | Admitting: Dietician

## 2022-12-26 ENCOUNTER — Ambulatory Visit (HOSPITAL_COMMUNITY): Payer: 59 | Admitting: Licensed Clinical Social Worker

## 2023-01-05 ENCOUNTER — Encounter: Payer: Self-pay | Admitting: Dietician

## 2023-01-05 ENCOUNTER — Encounter: Payer: 59 | Attending: General Surgery | Admitting: Dietician

## 2023-01-05 VITALS — Ht 66.5 in | Wt 251.0 lb

## 2023-01-05 DIAGNOSIS — E669 Obesity, unspecified: Secondary | ICD-10-CM | POA: Diagnosis present

## 2023-01-05 NOTE — Progress Notes (Signed)
Supervised Weight Loss Visit Bariatric Nutrition Education Appt Start Time: 8:05   End Time: 8:32  Planned surgery: Sleeve Gastrectomy  3 out of 3 SWL Appointments  Not cleared at this time:  Pt to follow up for minimum of one more visit to assist pt with progressing through stages of change/further nutrition education. RD advised pt that this follow up visit is not mandated through insurance. Pt verbalized agreement.  NUTRITION ASSESSMENT  Anthropometrics  Start weight at [facility]: 243.7 lbs (date: 09/11/2022)  Height: 66.5 in Weight today: 251.0 lbs BMI: 39.91 kg/m2     Clinical  Medical hx: Sleep Apnea Medications: Oxybutynin, diethylpropion  Labs: Vit D 19.1 Notable signs/symptoms: none noted Any previous deficiencies? No  Lifestyle & Dietary Hx  Pt states she loves going to the gym, stating that is where she gets peace. Pt states she is not tracking her protein, stating she knows she knows she is not getting enough.  Pt states she has tried to increase protein intake. Pt states she is not hungry due to appetite suppressant medication diethylpropion, stating she will skip meals. Pt states she started taking vitamin D. Pt states she is doing well practicing not drinking with meals. Pt states she is afraid of not being able to follow post op food patterns and strategies. Pt agreeable to return for additional follow-up, at least one more visit before clearance.  Estimated daily fluid intake: 32-48 oz Supplements: One a day for women; biotin, vit D Current average weekly physical activity: gym 3-4 days a week, gym this morning. [place], 3 miles on Saturday and Sunday each.  24-Hr Dietary Recall First Meal: eggs with cheese, coffee or oatmeal and sausage or protein shake Snack:  Second Meal: skip Snack: banana Third Meal: macaroni and cheese or chicken and salad Snack:  Beverages: water, coffee  Estimated Energy Needs Calories: 1500  NUTRITION DIAGNOSIS   Overweight/obesity ([place]-3.3) related to past poor dietary habits and physical inactivity as evidenced by patient w/ planned sleeve surgery following dietary guidelines for continued weight loss.  NUTRITION INTERVENTION  Nutrition counseling (C-1) and education (E-2) to facilitate bariatric surgery goals.  Encouraged patient to honor their body's internal hunger and fullness cues.  Throughout the day, check in mentally and rate hunger. Stop eating when satisfied not full regardless of how much food is left on the plate.  Get more if still hungry 20-30 minutes later.  The key is to honor satisfaction so throughout the meal, rate fullness factor and stop when comfortably satisfied not physically full. The key is to honor hunger and fullness without any feelings of guilt or shame.  Pay attention to what the internal cues are, rather than any external factors. This will enhance the confidence you have in listening to your own body and following those internal cues enabling you to increase how often you eat when you are hungry not out of appetite and stop when you are satisfied not full.  Encouraged pt to continue to eat balanced meals inclusive of non starchy vegetables 2 times a day 7 days a week Encouraged pt to continue to drink a minium 64 fluid ounces with half being plain water to satisfy proper hydration   Encouraged pt to eat 3 small meals a day.   Pre-Op Goals Progress & New Goals Continue: Aim for at least 64 oz of fluids  Continue: Increase physical activity; gym 3 days a week; walking on weekend at the park Continue: Take OTC Vit D Continue: Practice  not drinking with meals and snacks Re-engage: Track Protein; Aim for at least 60-80 grams daily Re-engage: Consume 3 meals per day or try to eat every 3-5 hours  Handouts Provided Include  Protein Shake Criteria  Learning Style & Readiness for Change Teaching method utilized: Visual & Auditory  Demonstrated degree of understanding via:  Teach Back  Readiness Level: contemplative Barriers to learning/adherence to lifestyle change: reverts back to old habits  RD's Notes for next Visit  Patient progress toward chosen goals.   MONITORING & EVALUATION Dietary intake, weekly physical activity, body weight, and pre-op goals in 1 month.   Next Steps  Patient is to return to [facility] in two weeks for additional education and monitoring.

## 2023-01-20 ENCOUNTER — Ambulatory Visit: Payer: 59 | Admitting: Dietician

## 2023-01-26 ENCOUNTER — Ambulatory Visit (INDEPENDENT_AMBULATORY_CARE_PROVIDER_SITE_OTHER): Payer: 59 | Admitting: Licensed Clinical Social Worker

## 2023-01-26 DIAGNOSIS — F4320 Adjustment disorder, unspecified: Secondary | ICD-10-CM
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
## 2023-02-02 ENCOUNTER — Encounter: Payer: Self-pay | Admitting: Dietician

## 2023-02-02 ENCOUNTER — Encounter: Payer: 59 | Attending: Family Medicine | Admitting: Dietician

## 2023-02-02 VITALS — Ht 66.5 in | Wt 246.9 lb

## 2023-02-02 DIAGNOSIS — E669 Obesity, unspecified: Secondary | ICD-10-CM | POA: Diagnosis present

## 2023-02-02 NOTE — Progress Notes (Signed)
Supervised Weight Loss Visit Bariatric Nutrition Education Appt Start Time:  8:08  End Time: 8:33  Planned surgery: Sleeve Gastrectomy  4 out of 3 SW[facility] Appointments  Pt completed visits.   Pt has cleared nutrition requirements.   NUTRITION ASSESSMENT  Anthropometrics  Start weight at [facility]: 243.7 lbs (date: 09/11/2022)  Height: 66.5 in Weight today: 246 lbs BMI: 39.25 kg/m2     Clinical  Medical hx: Sleep Apnea Medications: Oxybutynin, diethylpropion  Labs: Vit D 19.1 Notable signs/symptoms: none noted Any previous deficiencies? No  Lifestyle & Dietary Hx  Pt states water has increased to at least 64 oz a day- pt states doesn't like using public restrooms which can sometimes inhibit her from drinking more water  Pt states physical activity has increased due to doing a challenge with son- gym 3x/week for 1 hr and walking along with bodyweight exercises  Pt states she is getting around 58-57 g protein  Pt states protein shakes work well for her due to her busy schedule in the morning  Pt states she is trying to be more aware of protein intake and looking at protein amount on labels  Pt states she is getting tired of cooking and is trying to find easy, quick meals to make  Pt states she is trying to wean herself off of coffee Pt states she stops eating early around 7:30-8 pm but is no longer skipping meals Pt states she is still taking Vit D and multivitamin  Pt states she is not drinking with meals Pt states that she feels anxious about the surgery   Estimated daily fluid intake: 64 +  oz Supplements: One a day for women; biotin, vit D Current average weekly physical activity: gym 3 days a week, gym this morning. Walk [place], 3 miles on Saturday and Sunday each. Wall push ups with son- 100 x day  24-Hr Dietary Recall First Meal: protein shake (premiere)  Snack:  Second Meal: egg, [place] sausage, fruit Snack:  Third Meal: protein bowl- steak/chicken, [place] rice,  broccoli/spinach/asparagus, cabbage soup, chili   Snack:  Beverages: water, coffee  Estimated Energy Needs Calories: 1500  NUTRITION DIAGNOSIS  Overweight/obesity ([place]-3.3) related to past poor dietary habits and physical inactivity as evidenced by patient w/ planned sleeve surgery following dietary guidelines for continued weight loss.  NUTRITION INTERVENTION  Nutrition counseling (C-1) and education (E-2) to facilitate bariatric surgery goals.  Encouraged patient to honor their body's internal hunger and fullness cues.  Throughout the day, check in mentally and rate hunger. Stop eating when satisfied not full regardless of how much food is left on the plate.  Get more if still hungry 20-30 minutes later.  The key is to honor satisfaction so throughout the meal, rate fullness factor and stop when comfortably satisfied not physically full. The key is to honor hunger and fullness without any feelings of guilt or shame.  Pay attention to what the internal cues are, rather than any external factors. This will enhance the confidence you have in listening to your own body and following those internal cues enabling you to increase how often you eat when you are hungry not out of appetite and stop when you are satisfied not full.  Encouraged pt to continue to eat balanced meals inclusive of non starchy vegetables 2 times a day 7 days a week Encouraged pt to continue to drink a minium 64 fluid ounces with half being plain water to satisfy proper hydration   Encouraged pt to eat 3  small meals a day.  Why you need complex carbohydrates: Whole grains and other complex carbohydrates are required to have a healthy diet. Whole grains provide fiber which can help with blood glucose levels and help keep you satiated. Fruits and starchy vegetables provide essential vitamins and minerals required for immune function, eyesight support, brain support, bone density, wound healing and many other functions within the body.  According to the current evidenced based 2020-2025 Dietary Guidelines for Americans, complex carbohydrates are part of a healthy eating pattern which is associated with a decreased risk for type 2 diabetes, cancers, and cardiovascular disease.    Pre-Op Goals Progress & New Goals Continue: Aim for at least 64 oz of fluids  Continue: Increase physical activity; gym 3 days a week; walking on weekend at the park Continue: Take OTC Vit D Continue: Practice not drinking with meals and snacks Continue: Track Protein; Aim for at least 60-80 grams daily Continue: Consume 3 meals per day or try to eat every 3-5 hours  Handouts Provided Include  Bariatric MyPlate (2nd copy- reviewed)  BELT brochure  Learning Style & Readiness for Change Teaching method utilized: Visual & Auditory  Demonstrated degree of understanding via: Teach Back  Readiness Level: preparation Barriers to learning/adherence to lifestyle change: reverts back to old habits  RD's Notes for next Visit  Patient progress toward chosen goals.   MONITORING & EVALUATION Dietary intake, weekly physical activity, body weight, and pre-op goals in 1 month.   Next Steps  Pt has completed visits. No further supervised visits required/recommended. Pt to return to [facility] for post-op class > 2 weeks prior to scheduled surgery

## 2023-02-25 LAB — LAB REPORT - SCANNED: EGFR: 72

## 2023-03-02 ENCOUNTER — Encounter: Payer: Self-pay | Admitting: Dietician

## 2023-03-02 ENCOUNTER — Encounter: Payer: 59 | Attending: General Surgery | Admitting: Dietician

## 2023-03-02 VITALS — Ht 66.5 in | Wt 250.5 lb

## 2023-03-02 DIAGNOSIS — E669 Obesity, unspecified: Secondary | ICD-10-CM

## 2023-03-02 NOTE — Progress Notes (Signed)
Pre-Operative Nutrition Class:    Patient was seen on 03/02/2023 for Pre-Operative Bariatric Surgery Education at [facility]    Surgery date: 04/13/2023 Surgery type: Sleeve Gastrectomy  Anthropometrics  Start weight at [facility]: 243.7 lbs (date: 09/11/2022)  Height: 66.5 in Weight today: 250.5 lbs BMI: 39.83 kg/m2     Clinical  Medical hx: Sleep Apnea Medications: Oxybutynin, phentermine  Labs: Vit D 19.1 Notable signs/symptoms: none noted Any previous deficiencies? No  Samples given per [facility]NT protocol. Patient educated on appropriate usage: [facility] Multivitamin Lot # [id number] Exp: 06/26  [facility] Calcium  Lot # [id number] Exp: 03/25  The following the learning objectives were met by the patient during this course: Identify Pre-Op Dietary Goals and will begin 2 weeks pre-operatively Identify appropriate sources of fluids and proteins  State protein recommendations and appropriate sources pre and post-operatively Identify Post-Operative Dietary Goals and will follow for 2 weeks post-operatively Identify appropriate multivitamin and calcium sources Describe the need for physical activity post-operatively and will follow MD recommendations State when to call healthcare provider regarding medication questions or post-operative complications When having a diagnosis of diabetes understanding hypoglycemia symptoms and the inclusion of 1 complex carbohydrate per meal  Handouts given during class include: Pre-Op Bariatric Surgery Diet Handout Protein Shake Handout Post-Op Bariatric Surgery Nutrition Handout BELT Program Information Flyer Support Group Information Flyer [facility] Bariatric Supplements Price List  Follow-Up Plan: Patient will follow-up at [facility] 2 weeks post operatively for diet advancement per MD.

## 2023-04-23 ENCOUNTER — Ambulatory Visit: Payer: Self-pay | Admitting: General Surgery

## 2023-04-29 NOTE — Patient Instructions (Addendum)
SURGICAL WAITING ROOM VISITATION Patients having surgery or a procedure may have no more than 2 support people in the waiting area - these visitors may rotate.    Children under the age of 95 must have an adult with them who is not the patient.  If the patient needs to stay at the hospital during part of their recovery, the visitor guidelines for inpatient rooms apply. Pre-op nurse will coordinate an appropriate time for 1 support person to accompany patient in pre-op.  This support person may not rotate.    Please refer to the [facility] website for the visitor guidelines for Inpatients (after your surgery is over and you are in a regular room).       Your procedure is scheduled on: 05-11-23   Report to [facility] Main Entrance    Report to admitting at 7:45 AM   Call this number if you have problems the morning of surgery [phone]   Do not eat food :After 6:00 PM the night before surgery.   After Midnight you may have the following liquids until 7:00 AM DAY OF SURGERY  Water Non-Citrus Juices (without pulp, NO RED-Apple, White grape, White cranberry) Black Coffee (NO MILK/CREAM OR CREAMERS, sugar ok)  Clear Tea (NO MILK/CREAM OR CREAMERS, sugar ok) regular and decaf                             Plain Jell-O (NO RED)                                           Fruit ices (not with fruit pulp, NO RED)                                     Popsicles (NO RED)                                                               Sports drinks like Gatorade (NO RED)                   The day of surgery:  Drink ONE (1) Pre-Surgery G2 at 7:00 AM the morning of surgery. Drink in one sitting. Do not sip.  This drink was given to you during your hospital  pre-op appointment visit. Nothing else to drink after completing the Pre-Surgery G2.          If you have questions, please contact your surgeon's office.   FOLLOW  ANY ADDITIONAL PRE OP INSTRUCTIONS YOU RECEIVED FROM YOUR  SURGEON'S OFFICE!!!     Oral Hygiene is also important to reduce your risk of infection.                                    Remember - BRUSH YOUR TEETH THE MORNING OF SURGERY WITH YOUR REGULAR TOOTHPASTE   Do NOT smoke after Midnight   Take these medicines the morning of surgery with A SIP OF WATER:   Levothyroxine  Tylenol if needed  Okay to use inhalers                              You may not have any metal on your body including hair pins, jewelry, and body piercing             Do not wear make-up, lotions, powders, perfumes or deodorant  Do not wear nail polish including gel and S&S, artificial/acrylic nails, or any other type of covering on natural nails including finger and toenails. If you have artificial nails, gel coating, etc. that needs to be removed by a nail salon please have this removed prior to surgery or surgery may need to be canceled/ delayed if the surgeon/ anesthesia feels like they are unable to be safely monitored.   Do not shave  48 hours prior to surgery.      Do not bring valuables to the hospital. [facility] IS NOT RESPONSIBLE   FOR VALUABLES.   Contacts, dentures or bridgework may not be worn into surgery.   Bring small overnight bag day of surgery.   DO NOT BRING YOUR HOME MEDICATIONS TO THE HOSPITAL. PHARMACY WILL DISPENSE MEDICATIONS LISTED ON YOUR MEDICATION LIST TO YOU DURING YOUR ADMISSION IN THE HOSPITAL!    Special Instructions: Bring a copy of your healthcare power of attorney and living will documents the day of surgery if you haven't scanned them before.              Please read over the following fact sheets you were given: IF YOU HAVE QUESTIONS ABOUT YOUR PRE-OP INSTRUCTIONS PLEASE CALL [phone] Paula Compton  If you received a COVID test during your pre-op visit  it is requested that you wear a mask when out in public, stay away from anyone that may not be feeling well and notify your surgeon if you develop symptoms. If you test positive for  Covid or have been in contact with anyone that has tested positive in the last 10 days please notify you surgeon.  [place] - Preparing for Surgery Before surgery, you can play an important role.  Because skin is not sterile, your skin needs to be as free of germs as possible.  You can reduce the number of germs on your skin by washing with CHG (chlorahexidine gluconate) soap before surgery.  CHG is an antiseptic cleaner which kills germs and bonds with the skin to continue killing germs even after washing. Please DO NOT use if you have an allergy to CHG or antibacterial soaps.  If your skin becomes reddened/irritated stop using the CHG and inform your nurse when you arrive at Short Stay. Do not shave (including legs and underarms) for at least 48 hours prior to the first CHG shower.  You may shave your face/neck.  Please follow these instructions carefully:  1.  Shower with CHG Soap the night before surgery and the  morning of surgery.  2.  If you choose to wash your hair, wash your hair first as usual with your normal  shampoo.  3.  After you shampoo, rinse your hair and body thoroughly to remove the shampoo.                             4.  Use CHG as you would any other liquid soap.  You can apply chg directly to the skin and wash.  Gently  with a scrungie or clean washcloth.  5.  Apply the CHG Soap to your body ONLY FROM THE NECK DOWN.   Do   not use on face/ open                           Wound or open sores. Avoid contact with eyes, ears mouth and   genitals (private parts).                       Wash face,  Genitals (private parts) with your normal soap.             6.  Wash thoroughly, paying special attention to the area where your    surgery  will be performed.  7.  Thoroughly rinse your body with warm water from the neck down.  8.  DO NOT shower/wash with your normal soap after using and rinsing off the CHG Soap.                9.  Pat yourself dry with a clean towel.            10.   Wear clean pajamas.            11.  Place clean sheets on your bed the night of your first shower and do not  sleep with pets. Day of Surgery : Do not apply any lotions/deodorants the morning of surgery.  Please wear clean clothes to the hospital/surgery center.  FAILURE TO FOLLOW THESE INSTRUCTIONS MAY RESULT IN THE CANCELLATION OF YOUR SURGERY  PATIENT SIGNATURE_________________________________  NURSE SIGNATURE__________________________________  ________________________________________________________________________    Rogelia Mire  An incentive spirometer is a tool that can help keep your lungs clear and active. This tool measures how well you are filling your lungs with each breath. Taking long deep breaths may help reverse or decrease the chance of developing breathing (pulmonary) problems (especially infection) following: A long period of time when you are unable to move or be active. BEFORE THE PROCEDURE  If the spirometer includes an indicator to show your best effort, your nurse or respiratory therapist will set it to a desired goal. If possible, sit up straight or lean slightly forward. Try not to slouch. Hold the incentive spirometer in an upright position. INSTRUCTIONS FOR USE  Sit on the edge of your bed if possible, or sit up as far as you can in bed or on a chair. Hold the incentive spirometer in an upright position. Breathe out normally. Place the mouthpiece in your mouth and seal your lips tightly around it. Breathe in slowly and as deeply as possible, raising the piston or the ball toward the top of the column. Hold your breath for 3-5 seconds or for as long as possible. Allow the piston or ball to fall to the bottom of the column. Remove the mouthpiece from your mouth and breathe out normally. Rest for a few seconds and repeat Steps 1 through 7 at least 10 times every 1-2 hours when you are awake. Take your time and take a few normal breaths between deep  breaths. The spirometer may include an indicator to show your best effort. Use the indicator as a goal to work toward during each repetition. After each set of 10 deep breaths, practice coughing to be sure your lungs are clear. If you have an incision (the cut made at the time of surgery), support your incision when coughing by  placing a pillow or rolled up towels firmly against it. Once you are able to get out of bed, walk around indoors and cough well. You may stop using the incentive spirometer when instructed by your caregiver.  RISKS AND COMPLICATIONS Take your time so you do not get dizzy or light-headed. If you are in pain, you may need to take or ask for pain medication before doing incentive spirometry. It is harder to take a deep breath if you are having pain. AFTER USE Rest and breathe slowly and easily. It can be helpful to keep track of a log of your progress. Your caregiver can provide you with a simple table to help with this. If you are using the spirometer at home, follow these instructions: SEEK MEDICAL CARE IF:  You are having difficultly using the spirometer. You have trouble using the spirometer as often as instructed. Your pain medication is not giving enough relief while using the spirometer. You develop fever of 100.5 F (38.1 C) or higher. SEEK IMMEDIATE MEDICAL CARE IF:  You cough up bloody sputum that had not been present before. You develop fever of 102 F (38.9 C) or greater. You develop worsening pain at or near the incision site. MAKE SURE YOU:  Understand these instructions. Will watch your condition. Will get help right away if you are not doing well or get worse. Document Released: 02/23/2007 Document Revised: 01/05/2012 Document Reviewed: 04/26/2007 Exit[employer] Patient Information 2014 [employer], [place].   ________________________________________________________________________ WHAT IS A BLOOD TRANSFUSION? Blood Transfusion Information  A transfusion is  the replacement of blood or some of its parts. Blood is made up of multiple cells which provide different functions. Red blood cells carry oxygen and are used for blood loss replacement. White blood cells fight against infection. Platelets control bleeding. Plasma helps clot blood. Other blood products are available for specialized needs, such as hemophilia or other clotting disorders. BEFORE THE TRANSFUSION  Who gives blood for transfusions?  Healthy volunteers who are fully evaluated to make sure their blood is safe. This is blood bank blood. Transfusion therapy is the safest it has ever been in the practice of medicine. Before blood is taken from a donor, a complete history is taken to make sure that person has no history of diseases nor engages in risky social behavior (examples are intravenous drug use or sexual activity with multiple partners). The donor's travel history is screened to minimize risk of transmitting infections, such as malaria. The donated blood is tested for signs of infectious diseases, such as HIV and hepatitis. The blood is then tested to be sure it is compatible with you in order to minimize the chance of a transfusion reaction. If you or a relative donates blood, this is often done in anticipation of surgery and is not appropriate for emergency situations. It takes many days to process the donated blood. RISKS AND COMPLICATIONS Although transfusion therapy is very safe and saves many lives, the main dangers of transfusion include:  Getting an infectious disease. Developing a transfusion reaction. This is an allergic reaction to something in the blood you were given. Every precaution is taken to prevent this. The decision to have a blood transfusion has been considered carefully by your caregiver before blood is given. Blood is not given unless the benefits outweigh the risks. AFTER THE TRANSFUSION Right after receiving a blood transfusion, you will usually feel much better  and more energetic. This is especially true if your red blood cells have gotten low (anemic). The  transfusion raises the level of the red blood cells which carry oxygen, and this usually causes an energy increase. The nurse administering the transfusion will monitor you carefully for complications. HOME CARE INSTRUCTIONS  No special instructions are needed after a transfusion. You may find your energy is better. Speak with your caregiver about any limitations on activity for underlying diseases you may have. SEEK MEDICAL CARE IF:  Your condition is not improving after your transfusion. You develop redness or irritation at the intravenous (IV) site. SEEK IMMEDIATE MEDICAL CARE IF:  Any of the following symptoms occur over the next 12 hours: Shaking chills. You have a temperature by mouth above 102 F (38.9 C), not controlled by medicine. Chest, back, or muscle pain. People around you feel you are not acting correctly or are confused. Shortness of breath or difficulty breathing. Dizziness and fainting. You get a rash or develop hives. You have a decrease in urine output. Your urine turns a dark color or changes to pink, red, or brown. Any of the following symptoms occur over the next 10 days: You have a temperature by mouth above 102 F (38.9 C), not controlled by medicine. Shortness of breath. Weakness after normal activity. The white part of the eye turns yellow (jaundice). You have a decrease in the amount of urine or are urinating less often. Your urine turns a dark color or changes to pink, red, or brown. Document Released: 10/10/2000 Document Revised: 01/05/2012 Document Reviewed: 05/29/2008 [facility] Patient Information 2014 [place], [place].  _______________________________________________________________________

## 2023-05-04 ENCOUNTER — Other Ambulatory Visit: Payer: Self-pay

## 2023-05-04 ENCOUNTER — Encounter (HOSPITAL_COMMUNITY): Payer: Self-pay

## 2023-05-04 ENCOUNTER — Encounter (HOSPITAL_COMMUNITY)
Admission: RE | Admit: 2023-05-04 | Discharge: 2023-05-04 | Disposition: A | Discharge status: Home / Self Care | Payer: 59 | Source: Ambulatory Visit | Attending: General Surgery | Admitting: General Surgery

## 2023-05-04 DIAGNOSIS — Z01812 Encounter for preprocedural laboratory examination: Secondary | ICD-10-CM | POA: Insufficient documentation

## 2023-05-04 HISTORY — DX: Essential (primary) hypertension: I10

## 2023-05-04 HISTORY — DX: Sleep apnea, unspecified: G47.30

## 2023-05-04 HISTORY — DX: Gastro-esophageal reflux disease without esophagitis: K21.9

## 2023-05-04 LAB — CBC WITH DIFFERENTIAL/PLATELET
Abs Immature Granulocytes: 0.04 10*3/uL (ref 0.00–0.07)
Basophils Absolute: 0.1 10*3/uL (ref 0.0–0.1)
Basophils Relative: 1 %
Eosinophils Absolute: 0.4 10*3/uL (ref 0.0–0.5)
Eosinophils Relative: 3 %
HCT: 43.9 % (ref 36.0–46.0)
Hemoglobin: 14.2 g/dL (ref 12.0–15.0)
Immature Granulocytes: 0 %
Lymphocytes Relative: 22 %
Lymphs Abs: 2.3 10*3/uL (ref 0.7–4.0)
MCH: 28.9 pg (ref 26.0–34.0)
MCHC: 32.3 g/dL (ref 30.0–36.0)
MCV: 89.4 fL (ref 80.0–100.0)
Monocytes Absolute: 0.8 10*3/uL (ref 0.1–1.0)
Monocytes Relative: 7 %
Neutro Abs: 7 10*3/uL (ref 1.7–7.7)
Neutrophils Relative %: 67 %
Platelets: 322 10*3/uL (ref 150–400)
RBC: 4.91 MIL/uL (ref 3.87–5.11)
RDW: 13.6 % (ref 11.5–15.5)
WBC: 10.6 10*3/uL — ABNORMAL HIGH (ref 4.0–10.5)
nRBC: 0 % (ref 0.0–0.2)

## 2023-05-04 LAB — COMPREHENSIVE METABOLIC PANEL
ALT: 36 U/L (ref 0–44)
AST: 32 U/L (ref 15–41)
Albumin: 3.7 g/dL (ref 3.5–5.0)
Alkaline Phosphatase: 81 U/L (ref 38–126)
Anion gap: 7 (ref 5–15)
BUN: 16 mg/dL (ref 6–20)
CO2: 25 mmol/L (ref 22–32)
Calcium: 8.6 mg/dL — ABNORMAL LOW (ref 8.9–10.3)
Chloride: 105 mmol/L (ref 98–111)
Creatinine, Ser: 0.87 mg/dL (ref 0.44–1.00)
GFR, Estimated: >60 mL/min (ref >60)
Glucose, Bld: 89 mg/dL (ref 70–99)
Potassium: 3.8 mmol/L (ref 3.5–5.1)
Sodium: 137 mmol/L (ref 135–145)
Total Bilirubin: 0.6 mg/dL (ref 0.3–1.2)
Total Protein: 7.9 g/dL (ref 6.5–8.1)

## 2023-05-04 NOTE — Progress Notes (Addendum)
Anesthesia Review:  PCP: Carmelia Roller and OB/GYN- DR WEin  Cardiologist : none  Chest x-ray :09/10/22  EKG : 09/10/22  Echo : Stress test: Cardiac Cath :  Activity level: can do a flgiht of stairs without difficutoy  Sleep Study/ CPAP : sleep apnea no cpap  Fasting Blood Sugar :      / Checks Blood Sugar -- times a day:   Blood Thinner/ Instructions /Last Dose: ASA / Instructions/ Last Dose :    Phentermine- last dose on 04/28/23 per pt

## 2023-05-11 NOTE — Anesthesia Preprocedure Evaluation (Signed)
Anesthesia Evaluation  Patient identified by MRN, date of birth, ID band Patient awake    Reviewed: Allergy & Precautions, NPO status , Patient's Chart, lab work & pertinent test results  Airway Mallampati: III  TM Distance: >3 FB Neck ROM: Full    Dental  (+) Teeth Intact, Dental Advisory Given   Pulmonary sleep apnea (noncompliant w/ CPAP, sleep study 2019)    Pulmonary exam normal breath sounds clear to auscultation       Cardiovascular hypertension (114/73 preop), Pt. on medications Normal cardiovascular exam Rhythm:Regular Rate:Normal     Neuro/Psych  Headaches  negative psych ROS   GI/Hepatic Neg liver ROS,GERD  Controlled,,  Endo/Other    Morbid obesityBMI 40  Renal/GU negative Renal ROS  negative genitourinary   Musculoskeletal negative musculoskeletal ROS (+)    Abdominal  (+) + obese  Peds  Hematology negative hematology ROS (+) Hb 14.2   Anesthesia Other Findings   Reproductive/Obstetrics negative OB ROS S/p TL  Urine preg neg today                              Anesthesia Physical Anesthesia Plan  ASA: 3  Anesthesia Plan: General   Post-op Pain Management: Tylenol PO (pre-op)*, Ketamine IV* and Dilaudid IV   Induction: Intravenous  PONV Risk Score and Plan: 4 or greater and Ondansetron, Dexamethasone, Midazolam, Scopolamine patch - Pre-op and Treatment may vary due to age or medical condition  Airway Management Planned: Oral ETT  Additional Equipment: None  Intra-op Plan:   Post-operative Plan: Extubation in OR  Informed Consent: I have reviewed the patients History and Physical, chart, labs and discussed the procedure including the risks, benefits and alternatives for the proposed anesthesia with the patient or authorized representative who has indicated his/her understanding and acceptance.     Dental advisory given  Plan Discussed with: CRNA  Anesthesia  Plan Comments:        Anesthesia Quick Evaluation

## 2023-05-12 ENCOUNTER — Other Ambulatory Visit: Payer: Self-pay

## 2023-05-12 ENCOUNTER — Ambulatory Visit (HOSPITAL_BASED_OUTPATIENT_CLINIC_OR_DEPARTMENT_OTHER): Payer: 59 | Admitting: Anesthesiology

## 2023-05-12 ENCOUNTER — Encounter (HOSPITAL_COMMUNITY): Payer: Self-pay | Admitting: General Surgery

## 2023-05-12 ENCOUNTER — Observation Stay (HOSPITAL_COMMUNITY)
Admission: RE | Admit: 2023-05-12 | Discharge: 2023-05-13 | Disposition: A | Discharge status: Home / Self Care | Payer: 59 | Attending: General Surgery | Admitting: General Surgery

## 2023-05-12 ENCOUNTER — Ambulatory Visit (HOSPITAL_COMMUNITY): Payer: 59 | Admitting: Physician Assistant

## 2023-05-12 ENCOUNTER — Encounter (HOSPITAL_COMMUNITY)
Admission: RE | Disposition: A | Discharge status: Home / Self Care | Payer: Self-pay | Source: Home / Self Care | Attending: General Surgery

## 2023-05-12 DIAGNOSIS — E669 Obesity, unspecified: Principal | ICD-10-CM | POA: Diagnosis present

## 2023-05-12 DIAGNOSIS — I10 Essential (primary) hypertension: Secondary | ICD-10-CM | POA: Insufficient documentation

## 2023-05-12 DIAGNOSIS — G4730 Sleep apnea, unspecified: Secondary | ICD-10-CM | POA: Diagnosis not present

## 2023-05-12 DIAGNOSIS — Z6841 Body Mass Index (BMI) 40.0 and over, adult: Secondary | ICD-10-CM

## 2023-05-12 DIAGNOSIS — Z79899 Other long term (current) drug therapy: Secondary | ICD-10-CM | POA: Diagnosis not present

## 2023-05-12 DIAGNOSIS — Z6839 Body mass index (BMI) 39.0-39.9, adult: Secondary | ICD-10-CM | POA: Diagnosis not present

## 2023-05-12 HISTORY — PX: UPPER GI ENDOSCOPY: SHX6162

## 2023-05-12 HISTORY — PX: HIATAL HERNIA REPAIR: SHX195

## 2023-05-12 HISTORY — PX: LAPAROSCOPIC GASTRIC SLEEVE RESECTION: SHX5895

## 2023-05-12 LAB — CBC
HCT: 43.2 % (ref 36.0–46.0)
Hemoglobin: 13.8 g/dL (ref 12.0–15.0)
MCH: 29.7 pg (ref 26.0–34.0)
MCHC: 31.9 g/dL (ref 30.0–36.0)
MCV: 92.9 fL (ref 80.0–100.0)
Platelets: 303 10*3/uL (ref 150–400)
RBC: 4.65 MIL/uL (ref 3.87–5.11)
RDW: 13.4 % (ref 11.5–15.5)
WBC: 16.7 10*3/uL — ABNORMAL HIGH (ref 4.0–10.5)
nRBC: 0 % (ref 0.0–0.2)

## 2023-05-12 LAB — CREATININE, SERUM
Creatinine, Ser: 1.04 mg/dL — ABNORMAL HIGH (ref 0.44–1.00)
GFR, Estimated: >60 mL/min (ref >60)

## 2023-05-12 LAB — TYPE AND SCREEN
ABO/RH(D): O POS
Antibody Screen: NEGATIVE

## 2023-05-12 LAB — ABO/RH: ABO/RH(D): O POS

## 2023-05-12 LAB — POCT PREGNANCY, URINE: Preg Test, Ur: NEGATIVE

## 2023-05-12 SURGERY — GASTRECTOMY, SLEEVE, LAPAROSCOPIC
Anesthesia: General

## 2023-05-12 MED ORDER — CHLORHEXIDINE GLUCONATE CLOTH 2 % EX PADS: 6.0000 | MEDICATED_PAD | Freq: Once | CUTANEOUS | Status: DC

## 2023-05-12 MED ORDER — PROPOFOL 10 MG/ML IV BOLUS
INTRAVENOUS | Status: AC
  Filled 2023-05-12: qty 20

## 2023-05-12 MED ORDER — DEXAMETHASONE SODIUM PHOSPHATE 4 MG/ML IJ SOLN: 4.0000 mg | INTRAMUSCULAR | Status: DC

## 2023-05-12 MED ORDER — ONDANSETRON HCL 4 MG/2ML IJ SOLN
INTRAMUSCULAR | Status: DC | PRN
Start: 2023-05-12 — End: 2023-05-12
  Administered 2023-05-12: 4 mg via INTRAVENOUS

## 2023-05-12 MED ORDER — APREPITANT 40 MG PO CAPS
40.0000 mg | ORAL_CAPSULE | ORAL | Status: AC
  Administered 2023-05-12: 40 mg via ORAL
  Filled 2023-05-12: qty 1

## 2023-05-12 MED ORDER — SCOPOLAMINE 1 MG/3DAYS TD PT72
1.0000 | MEDICATED_PATCH | TRANSDERMAL | Status: DC
  Administered 2023-05-12: 1.5 mg via TRANSDERMAL
  Filled 2023-05-12: qty 1

## 2023-05-12 MED ORDER — PROPOFOL 10 MG/ML IV BOLUS
INTRAVENOUS | Status: DC | PRN
  Administered 2023-05-12: 200 mg via INTRAVENOUS

## 2023-05-12 MED ORDER — AMISULPRIDE (ANTIEMETIC) 5 MG/2ML IV SOLN: 10.0000 mg | Freq: Once | INTRAVENOUS | Status: DC | PRN

## 2023-05-12 MED ORDER — EPHEDRINE SULFATE (PRESSORS) 50 MG/ML IJ SOLN
INTRAMUSCULAR | Status: DC | PRN
  Administered 2023-05-12: 5 mg via INTRAVENOUS

## 2023-05-12 MED ORDER — HYDROMORPHONE HCL 1 MG/ML IJ SOLN
INTRAMUSCULAR | Status: AC
  Filled 2023-05-12: qty 1

## 2023-05-12 MED ORDER — SIMETHICONE 80 MG PO CHEW: 80.0000 mg | CHEWABLE_TABLET | Freq: Four times a day (QID) | ORAL | Status: DC | PRN

## 2023-05-12 MED ORDER — LACTATED RINGERS IR SOLN
Status: DC | PRN
  Administered 2023-05-12: 1000 mL

## 2023-05-12 MED ORDER — ACETAMINOPHEN 160 MG/5ML PO SOLN: 1000.0000 mg | Freq: Three times a day (TID) | ORAL | Status: DC

## 2023-05-12 MED ORDER — ENSURE MAX PROTEIN PO LIQD
2.0000 [oz_av] | ORAL | Status: DC
  Administered 2023-05-13 (×3): 2 [oz_av] via ORAL

## 2023-05-12 MED ORDER — DEXTROSE-SODIUM CHLORIDE 5-0.45 % IV SOLN: INTRAVENOUS | Status: DC

## 2023-05-12 MED ORDER — APREPITANT 40 MG PO CAPS
ORAL_CAPSULE | ORAL | Status: AC
  Filled 2023-05-12: qty 1

## 2023-05-12 MED ORDER — FENTANYL CITRATE (PF) 100 MCG/2ML IJ SOLN
INTRAMUSCULAR | Status: DC | PRN
  Administered 2023-05-12: 100 ug via INTRAVENOUS
  Administered 2023-05-12: 50 ug via INTRAVENOUS
  Administered 2023-05-12: 100 ug via INTRAVENOUS

## 2023-05-12 MED ORDER — ONDANSETRON HCL 4 MG/2ML IJ SOLN
4.0000 mg | INTRAMUSCULAR | Status: DC | PRN
  Administered 2023-05-12: 4 mg via INTRAVENOUS
  Filled 2023-05-12: qty 2

## 2023-05-12 MED ORDER — LOSARTAN POTASSIUM 50 MG PO TABS
50.0000 mg | ORAL_TABLET | Freq: Every day | ORAL | Status: DC
  Administered 2023-05-12 – 2023-05-13 (×2): 50 mg via ORAL
  Filled 2023-05-12 (×2): qty 1

## 2023-05-12 MED ORDER — SUGAMMADEX SODIUM 500 MG/5ML IV SOLN
INTRAVENOUS | Status: DC | PRN
  Administered 2023-05-12: 400 mg via INTRAVENOUS

## 2023-05-12 MED ORDER — CHLORHEXIDINE GLUCONATE 0.12 % MT SOLN: 15.0000 mL | Freq: Once | OROMUCOSAL | Status: DC

## 2023-05-12 MED ORDER — DEXAMETHASONE SODIUM PHOSPHATE 10 MG/ML IJ SOLN
INTRAMUSCULAR | Status: DC | PRN
  Administered 2023-05-12: 10 mg via INTRAVENOUS

## 2023-05-12 MED ORDER — FENTANYL CITRATE (PF) 250 MCG/5ML IJ SOLN
INTRAMUSCULAR | Status: AC
  Filled 2023-05-12: qty 5

## 2023-05-12 MED ORDER — ORAL CARE MOUTH RINSE: 15.0000 mL | Freq: Once | OROMUCOSAL | Status: DC

## 2023-05-12 MED ORDER — BUPIVACAINE HCL (PF) 0.25 % IJ SOLN
INTRAMUSCULAR | Status: AC
  Filled 2023-05-12: qty 30

## 2023-05-12 MED ORDER — ROCURONIUM BROMIDE 100 MG/10ML IV SOLN
INTRAVENOUS | Status: DC | PRN
  Administered 2023-05-12: 100 mg via INTRAVENOUS

## 2023-05-12 MED ORDER — OXYCODONE HCL 5 MG PO TABS: 5.0000 mg | ORAL_TABLET | Freq: Once | ORAL | Status: AC | PRN

## 2023-05-12 MED ORDER — KETAMINE HCL 10 MG/ML IJ SOLN
INTRAMUSCULAR | Status: DC | PRN
  Administered 2023-05-12: 20 mg via INTRAVENOUS
  Administered 2023-05-12: 30 mg via INTRAVENOUS

## 2023-05-12 MED ORDER — ONDANSETRON HCL 4 MG/2ML IJ SOLN: 4.0000 mg | Freq: Once | INTRAMUSCULAR | Status: DC | PRN

## 2023-05-12 MED ORDER — HEPARIN SODIUM (PORCINE) 5000 UNIT/ML IJ SOLN
5000.0000 [IU] | Freq: Three times a day (TID) | INTRAMUSCULAR | Status: DC
  Administered 2023-05-12 – 2023-05-13 (×2): 5000 [IU] via SUBCUTANEOUS
  Filled 2023-05-12 (×2): qty 1

## 2023-05-12 MED ORDER — KETAMINE HCL 50 MG/5ML IJ SOSY
PREFILLED_SYRINGE | INTRAMUSCULAR | Status: AC
  Filled 2023-05-12: qty 5

## 2023-05-12 MED ORDER — HYDROMORPHONE HCL 1 MG/ML IJ SOLN
0.2500 mg | INTRAMUSCULAR | Status: DC | PRN
  Administered 2023-05-12: 0.25 mg via INTRAVENOUS
  Administered 2023-05-12: 0.5 mg via INTRAVENOUS
  Administered 2023-05-12: 0.25 mg via INTRAVENOUS
  Administered 2023-05-12: 0.5 mg via INTRAVENOUS

## 2023-05-12 MED ORDER — BUPIVACAINE LIPOSOME 1.3 % IJ SUSP
INTRAMUSCULAR | Status: AC
  Filled 2023-05-12: qty 20

## 2023-05-12 MED ORDER — BUPIVACAINE LIPOSOME 1.3 % IJ SUSP: 20.0000 mL | Freq: Once | INTRAMUSCULAR | Status: DC

## 2023-05-12 MED ORDER — BUPIVACAINE LIPOSOME 1.3 % IJ SUSP
INTRAMUSCULAR | Status: DC | PRN
  Administered 2023-05-12: 50 mL

## 2023-05-12 MED ORDER — MORPHINE SULFATE (PF) 2 MG/ML IV SOLN: 1.0000 mg | INTRAVENOUS | Status: DC | PRN

## 2023-05-12 MED ORDER — 0.9 % SODIUM CHLORIDE (POUR BTL) OPTIME
TOPICAL | Status: DC | PRN
  Administered 2023-05-12: 1000 mL

## 2023-05-12 MED ORDER — HEPARIN SODIUM (PORCINE) 5000 UNIT/ML IJ SOLN
5000.0000 [IU] | INTRAMUSCULAR | Status: AC
  Administered 2023-05-12: 5000 [IU] via SUBCUTANEOUS
  Filled 2023-05-12: qty 1

## 2023-05-12 MED ORDER — LACTATED RINGERS IV SOLN: INTRAVENOUS | Status: DC

## 2023-05-12 MED ORDER — ACETAMINOPHEN 500 MG PO TABS
1000.0000 mg | ORAL_TABLET | Freq: Three times a day (TID) | ORAL | Status: DC
  Administered 2023-05-12 – 2023-05-13 (×3): 1000 mg via ORAL
  Filled 2023-05-12 (×3): qty 2

## 2023-05-12 MED ORDER — OXYCODONE HCL 5 MG/5ML PO SOLN
ORAL | Status: AC
  Filled 2023-05-12: qty 5

## 2023-05-12 MED ORDER — GABAPENTIN 100 MG PO CAPS
200.0000 mg | ORAL_CAPSULE | Freq: Two times a day (BID) | ORAL | Status: DC
  Administered 2023-05-12 – 2023-05-13 (×3): 200 mg via ORAL
  Filled 2023-05-12 (×3): qty 2

## 2023-05-12 MED ORDER — FAMOTIDINE IN NACL 20-0.9 MG/50ML-% IV SOLN
20.0000 mg | Freq: Two times a day (BID) | INTRAVENOUS | Status: DC
  Administered 2023-05-12 – 2023-05-13 (×3): 20 mg via INTRAVENOUS
  Filled 2023-05-12 (×3): qty 50

## 2023-05-12 MED ORDER — MEPERIDINE HCL 50 MG/ML IJ SOLN: 6.2500 mg | INTRAMUSCULAR | Status: DC | PRN

## 2023-05-12 MED ORDER — ACETAMINOPHEN 500 MG PO TABS
1000.0000 mg | ORAL_TABLET | ORAL | Status: AC
  Administered 2023-05-12: 1000 mg via ORAL
  Filled 2023-05-12: qty 2

## 2023-05-12 MED ORDER — MIDAZOLAM HCL 5 MG/5ML IJ SOLN
INTRAMUSCULAR | Status: DC | PRN
  Administered 2023-05-12: 2 mg via INTRAVENOUS

## 2023-05-12 MED ORDER — LIDOCAINE HCL (CARDIAC) PF 100 MG/5ML IV SOSY
PREFILLED_SYRINGE | INTRAVENOUS | Status: DC | PRN
  Administered 2023-05-12: 60 mg via INTRAVENOUS

## 2023-05-12 MED ORDER — SODIUM CHLORIDE 0.9 % IV SOLN
2.0000 g | INTRAVENOUS | Status: AC
  Administered 2023-05-12: 2 g via INTRAVENOUS
  Filled 2023-05-12: qty 2

## 2023-05-12 MED ORDER — OXYCODONE HCL 5 MG/5ML PO SOLN: 5.0000 mg | Freq: Four times a day (QID) | ORAL | Status: DC | PRN

## 2023-05-12 MED ORDER — PHENYLEPHRINE HCL (PRESSORS) 10 MG/ML IV SOLN
INTRAVENOUS | Status: DC | PRN
  Administered 2023-05-12 (×2): 200 ug via INTRAVENOUS

## 2023-05-12 MED ORDER — OXYCODONE HCL 5 MG/5ML PO SOLN
5.0000 mg | Freq: Once | ORAL | Status: AC | PRN
  Administered 2023-05-12: 5 mg via ORAL

## 2023-05-12 MED ORDER — OXYBUTYNIN CHLORIDE ER 5 MG PO TB24
10.0000 mg | ORAL_TABLET | Freq: Every day | ORAL | Status: DC
  Administered 2023-05-12 – 2023-05-13 (×2): 10 mg via ORAL
  Filled 2023-05-12 (×2): qty 2

## 2023-05-12 MED ORDER — STERILE WATER FOR IRRIGATION IR SOLN
Status: DC | PRN
  Administered 2023-05-12: 500 mL

## 2023-05-12 MED ORDER — HYDRALAZINE HCL 20 MG/ML IJ SOLN: 10.0000 mg | INTRAMUSCULAR | Status: DC | PRN

## 2023-05-12 MED ORDER — MIDAZOLAM HCL 2 MG/2ML IJ SOLN
INTRAMUSCULAR | Status: AC
  Filled 2023-05-12: qty 2

## 2023-05-12 SURGICAL SUPPLY — 66 items
ANTIFOG SOL W/FOAM PAD STRL (MISCELLANEOUS) ×1
APL PRP STRL LF DISP 70% ISPRP (MISCELLANEOUS) ×1
APL SKNCLS STERI-STRIP NONHPOA (GAUZE/BANDAGES/DRESSINGS) ×1
APPLIER CLIP ROT 13.4 12 LRG (CLIP) ×1
APR CLP LRG 13.4X12 ROT 20 MLT (CLIP) ×1
BAG COUNTER SPONGE SURGICOUNT (BAG) IMPLANT
BAG LAPAROSCOPIC 12 15 PORT 16 (BASKET) ×1 IMPLANT
BAG RETRIEVAL 12/15 (BASKET) ×1
BAG SPNG CNTER NS LX DISP (BAG)
BENZOIN TINCTURE PRP APPL 2/3 (GAUZE/BANDAGES/DRESSINGS) ×1 IMPLANT
BLADE SURG SZ11 CARB STEEL (BLADE) ×1 IMPLANT
BNDG ADH 1X3 SHEER STRL LF (GAUZE/BANDAGES/DRESSINGS) ×6 IMPLANT
BNDG ADH THN 3X1 STRL LF (GAUZE/BANDAGES/DRESSINGS) ×6
CABLE HIGH FREQUENCY MONO STRZ (ELECTRODE) IMPLANT
CHLORAPREP W/TINT 26 (MISCELLANEOUS) ×1 IMPLANT
CLIP APPLIE ROT 13.4 12 LRG (CLIP) IMPLANT
COVER SURGICAL LIGHT HANDLE (MISCELLANEOUS) ×1 IMPLANT
DRAPE UTILITY XL STRL (DRAPES) ×2 IMPLANT
ELECT REM PT RETURN 15FT ADLT (MISCELLANEOUS) ×1 IMPLANT
GAUZE 4X4 16PLY ~~LOC~~+RFID DBL (SPONGE) ×1 IMPLANT
GLOVE BIOGEL PI IND STRL 7.0 (GLOVE) ×1 IMPLANT
GLOVE SURG SS PI 7.0 STRL IVOR (GLOVE) ×1 IMPLANT
GOWN STRL REUS W/ TWL LRG LVL3 (GOWN DISPOSABLE) ×1 IMPLANT
GOWN STRL REUS W/ TWL XL LVL3 (GOWN DISPOSABLE) IMPLANT
GOWN STRL REUS W/TWL LRG LVL3 (GOWN DISPOSABLE) ×1
GOWN STRL REUS W/TWL XL LVL3 (GOWN DISPOSABLE)
GRASPER SUT TROCAR 14GX15 (MISCELLANEOUS) ×1 IMPLANT
IRRIG SUCT STRYKERFLOW 2 WTIP (MISCELLANEOUS) ×1
IRRIGATION SUCT STRKRFLW 2 WTP (MISCELLANEOUS) ×1 IMPLANT
KIT BASIN OR (CUSTOM PROCEDURE TRAY) ×1 IMPLANT
KIT TURNOVER KIT A (KITS) IMPLANT
MARKER SKIN DUAL TIP RULER LAB (MISCELLANEOUS) ×1 IMPLANT
MAT PREVALON FULL STRYKER (MISCELLANEOUS) IMPLANT
NDL SPNL 22GX3.5 QUINCKE BK (NEEDLE) ×1 IMPLANT
NEEDLE SPNL 22GX3.5 QUINCKE BK (NEEDLE) ×1 IMPLANT
PACK UNIVERSAL I (CUSTOM PROCEDURE TRAY) ×1 IMPLANT
RELOAD STAPLE 60 3.6 BLU REG (STAPLE) IMPLANT
RELOAD STAPLE 60 3.8 GOLD REG (STAPLE) IMPLANT
RELOAD STAPLE 60 4.1 GRN THCK (STAPLE) IMPLANT
RELOAD STAPLER BLUE 60MM (STAPLE) ×3 IMPLANT
RELOAD STAPLER GOLD 60MM (STAPLE) ×2 IMPLANT
RELOAD STAPLER GREEN 60MM (STAPLE) IMPLANT
SCISSORS LAP 5X45 EPIX DISP (ENDOMECHANICALS) IMPLANT
SET TUBE SMOKE EVAC HIGH FLOW (TUBING) ×1 IMPLANT
SHEARS HARMONIC ACE PLUS 45CM (MISCELLANEOUS) ×1 IMPLANT
SLEEVE GASTRECTOMY 40FR VISIGI (MISCELLANEOUS) ×1 IMPLANT
SLEEVE Z-THREAD 5X100MM (TROCAR) ×2 IMPLANT
SOLUTION ANTFG W/FOAM PAD STRL (MISCELLANEOUS) ×1 IMPLANT
SPIKE FLUID TRANSFER (MISCELLANEOUS) ×1 IMPLANT
STAPLER ECHELON LONG 3000 60 (ENDOMECHANICALS) ×1 IMPLANT
STAPLER ECHELON LONG 60 440 (INSTRUMENTS) ×1 IMPLANT
STAPLER RELOAD BLUE 60MM (STAPLE) ×3
STAPLER RELOAD GOLD 60MM (STAPLE) ×2
STAPLER RELOAD GREEN 60MM (STAPLE)
STRIP CLOSURE SKIN 1/2X4 (GAUZE/BANDAGES/DRESSINGS) ×1 IMPLANT
SUT ETHIBOND 0 36 GRN (SUTURE) IMPLANT
SUT MNCRL AB 4-0 PS2 18 (SUTURE) ×1 IMPLANT
SUT VICRYL 0 TIES 12 18 (SUTURE) ×1 IMPLANT
SYR 20ML LL LF (SYRINGE) ×1 IMPLANT
SYR 50ML LL SCALE MARK (SYRINGE) ×1 IMPLANT
SYS KII OPTICAL ACCESS 15MM (TROCAR) ×1
SYSTEM KII OPTICAL ACCESS 15MM (TROCAR) ×1 IMPLANT
TOWEL OR 17X26 10 PK STRL BLUE (TOWEL DISPOSABLE) ×1 IMPLANT
TOWEL OR NON WOVEN STRL DISP B (DISPOSABLE) ×1 IMPLANT
TROCAR Z-THREAD OPTICAL 5X100M (TROCAR) ×1 IMPLANT
TUBING CONNECTING 10 (TUBING) ×2 IMPLANT

## 2023-05-12 NOTE — Plan of Care (Signed)
  Problem: Education: Goal: Knowledge of the prescribed self-care regimen will improve Outcome: Progressing   Problem: Activity: Goal: Ability to tolerate increased activity will improve Outcome: Progressing   Problem: Bowel/Gastric: Goal: Occurrences of nausea will decrease Outcome: Progressing   Problem: Fluid Volume: Goal: Maintenance of adequate hydration will improve Outcome: Progressing   Problem: Nutritional: Goal: Nutritional status will improve Outcome: Progressing   Problem: Pain Management: Goal: Pain level will decrease Outcome: Progressing   Problem: Education: Goal: Knowledge of General Education information will improve Description: Including pain rating scale, medication(s)/side effects and non-pharmacologic comfort measures Outcome: Progressing

## 2023-05-12 NOTE — Plan of Care (Signed)
  Problem: Education: Goal: Ability to state signs and symptoms to report to health care provider will improve Outcome: Progressing   Problem: Activity: Goal: Ability to tolerate increased activity will improve Outcome: Progressing   Problem: Bowel/Gastric: Goal: Gastrointestinal status for postoperative course will improve Outcome: Progressing

## 2023-05-12 NOTE — H&P (Signed)
Chief Complaint: Bariatric pre-op  History of Present Illness: Virginia [name] is a 47 y.o. female who is seen today for sleeve preop discussion.  She has started losartan for HTN in the last few months. She has no other new medications or medical issues. She has learned a lot working with the dieticians.  Review of Systems: A complete review of systems was obtained from the patient. I have reviewed this information and discussed as appropriate with the patient. See HPI as well for other ROS.  Review of Systems  Constitutional: Negative.  HENT: Negative.  Eyes: Negative.  Respiratory: Negative.  Cardiovascular: Negative.  Gastrointestinal: Negative.  Genitourinary: Negative.  Musculoskeletal: Negative.  Skin: Negative.  Neurological: Negative.  Endo/Heme/Allergies: Negative.  Psychiatric/Behavioral: Negative.    Medical History: Past Medical History:  Diagnosis Date  Sleep apnea   There is no problem list on file for this patient.  History reviewed. No pertinent surgical history.   No Known Allergies  Current Outpatient Medications on File Prior to Visit  Medication Sig Dispense Refill  losartan (COZAAR) 50 MG tablet Take 1 tablet by mouth once daily  calcium carbonate (TUMS E-X) 300 mg (750 mg) chewable tablet Take 300 mg of elemental by mouth every 2 (two) hours as needed for Heartburn  cholecalciferol 1000 unit tablet Take by mouth  multivitamin tablet Take 1 tablet by mouth once daily  oxyBUTYnin (DITROPAN-XL) 10 MG XL tablet Take 10 mg by mouth once daily  phentermine (ADIPEX-P) 37.5 MG capsule Take 37.5 mg by mouth every morning (Patient not taking: Reported on 03/19/2023)   No current facility-administered medications on file prior to visit.   Family History  Problem Relation Age of Onset  High blood pressure (Hypertension) Mother  Obesity Father  High blood pressure (Hypertension) Father    Social History   Tobacco Use  Smoking Status Never   Smokeless Tobacco Never    Social History   Socioeconomic History  Marital status: Married  Tobacco Use  Smoking status: Never  Smokeless tobacco: Never  Vaping Use  Vaping status: Never Used  Substance and Sexual Activity  Alcohol use: Never  Drug use: Never   Objective:   Vitals:  03/19/23 1010  BP: 124/84  Pulse: 104  Temp: 36.6 C (97.9 F)  SpO2: 99%  Weight: (!) 114 kg (251 lb 6.4 oz)  Height: 168.9 cm (5' 6.5")  PainSc: 0-No pain   Body mass index is 39.97 kg/m.  Physical Exam Constitutional:  Appearance: Normal appearance.  HENT:  Head: Normocephalic and atraumatic.  Pulmonary:  Effort: Pulmonary effort is normal.  Musculoskeletal:  General: Normal range of motion.  Cervical back: Normal range of motion.  Neurological:  General: No focal deficit present.  Mental Status: She is alert and oriented to person, place, and time. Mental status is at baseline.  Psychiatric:  Mood and Affect: Mood normal.  Behavior: Behavior normal.  Thought Content: Thought content normal.   Labs, Imaging and Diagnostic Testing:  I reviewed UGI images showing small hiatal hernia a reflux I reviewed Weston Brass Brown's notes. I reviewed labs  Assessment and Plan:   Diagnoses and all orders for this visit:  Class 2 severe obesity with body mass index (BMI) of 35 to 39.9 with serious comorbidity (CMS/HHS-HCC)  OSA (obstructive sleep apnea)  Essential hypertension    The patient meets weight loss surgery criteria. Due to the above reasons, I think minimally invasive vertical sleeve gastrectomy is the best option for the patient.   We discussed sleeve  gastrectomy. We discussed the preoperative, operative and postoperative process. I explained the surgery in detail including the performance of an EGD near the end of the surgery to test for leak. We discussed the typical hospital course including a 1-2 day stay baring any complications. The patient was given educational  material. I quoted the patient that most patients can lose up to 50-70% of their excess weight. We did discuss the possibility of weight regain several years after the procedure.   The risks of infection, bleeding, pain, scarring, weight regain, too little or too much weight loss, vitamin deficiencies and need for lifelong vitamin supplementation, hair loss, need for protein supplementation, leaks, stricture, reflux, food intolerance, gallstone formation, hernia, need for reoperation, need for open surgery, injury to spleen or surrounding structures, DVT's, PE, and death again discussed with the patient and the patient expressed understanding and desires to proceed with minimally invasive sleeve gastrectomy, possible open, intraoperative endoscopy.  We discussed that before and after surgery that there would be an alteration in their diet. I explained that we may put them on a diet 2 weeks before surgery. I also explained that they would be on a liquid diet for 2 weeks after surgery. We discussed that they would have to avoid certain foods after surgery. We discussed the importance of physical activity as well as compliance with our dietary and supplement recommendations and routine follow-up.

## 2023-05-12 NOTE — Op Note (Signed)
Preoperative diagnosis: laparoscopic sleeve gastrectomy  Postoperative diagnosis: Same   Procedure: Upper endoscopy   Surgeon: Berna Bue, M.D.  Anesthesia: Gen.   Description of procedure: The endoscope was placed in the mouth and oropharynx and under endoscopic vision it was advanced to the esophagogastric junction which was identified at 34cm from the teeth.  The pouch was tensely insufflated while the upper abdomen was flooded with irrigation to perform a leak test, which was negative. No bubbles were seen.  The staple line was hemostatic and the lumen was evenly tubular without undue narrowing, angulation or twisting specifically at the incisura angularis. The lumen was decompressed and the scope was withdrawn without difficulty.    Berna Bue, M.D. General, Bariatric, & Minimally Invasive Surgery [facility]

## 2023-05-12 NOTE — Discharge Instructions (Signed)

## 2023-05-12 NOTE — Anesthesia Postprocedure Evaluation (Signed)
Anesthesia Post Note  Patient: Careers adviser  Procedure(s) Performed: LAPAROSCOPIC SLEEVE GASTRECTOMY UPPER GI ENDOSCOPY HERNIA REPAIR HIATAL     Patient location during evaluation: PACU Anesthesia Type: General Level of consciousness: awake and alert, oriented and patient cooperative Pain management: pain level controlled Vital Signs Assessment: post-procedure vital signs reviewed and stable Respiratory status: spontaneous breathing, nonlabored ventilation and respiratory function stable Cardiovascular status: blood pressure returned to baseline and stable Postop Assessment: no apparent nausea or vomiting Anesthetic complications: no   No notable events documented.  Last Vitals:  Vitals:   05/12/23 1015 05/12/23 1033  BP: (!) 150/86 130/83  Pulse: (!) 53 (!) 57  Resp: 11 15  Temp: (!) 36.4 C   SpO2: 92% 100%    Last Pain:  Vitals:   05/12/23 1015  TempSrc:   PainSc: 0-No pain                 Lannie Fields

## 2023-05-12 NOTE — Anesthesia Procedure Notes (Signed)
Procedure Name: Intubation Date/Time: 05/12/2023 7:35 AM  Performed by: Shanon Payor, CRNAPre-anesthesia Checklist: Patient identified, Emergency Drugs available, Suction available, Patient being monitored and Timeout performed Patient Re-evaluated:Patient Re-evaluated prior to induction Oxygen Delivery Method: Circle system utilized Preoxygenation: Pre-oxygenation with 100% oxygen Induction Type: IV induction Ventilation: Mask ventilation without difficulty Laryngoscope Size: Mac and 3 Grade View: Grade I Tube type: Oral Tube size: 7.0 mm Number of attempts: 1 Airway Equipment and Method: Stylet Placement Confirmation: ETT inserted through vocal cords under direct vision, positive ETCO2, CO2 detector and breath sounds checked- equal and bilateral Secured at: 23 cm Tube secured with: Tape Dental Injury: Teeth and Oropharynx as per pre-operative assessment

## 2023-05-12 NOTE — Transfer of Care (Signed)
Immediate Anesthesia Transfer of Care Note  Patient: Virginia [name]  Procedure(s) Performed: LAPAROSCOPIC SLEEVE GASTRECTOMY UPPER GI ENDOSCOPY HERNIA REPAIR HIATAL  Patient Location: PACU  Anesthesia Type:General  Level of Consciousness: awake, alert , drowsy, and patient cooperative  Airway & Oxygen Therapy: Patient Spontanous Breathing and Patient connected to face mask oxygen  Post-op Assessment: Report given to RN, Post -op Vital signs reviewed and stable, and Patient moving all extremities X 4  Post vital signs: Reviewed and stable  Last Vitals:  Vitals Value Taken Time  BP 137/94 05/12/23 0915  Temp    Pulse 69 05/12/23 0915  Resp 14 05/12/23 0915  SpO2 99 % 05/12/23 0915  Vitals shown include unfiled device data.  Last Pain:  Vitals:   05/12/23 0610  TempSrc: Oral  PainSc:          Complications: No notable events documented.

## 2023-05-12 NOTE — Progress Notes (Signed)
PHARMACY CONSULT FOR:  Risk Assessment for Post-Discharge VTE Following Bariatric Surgery  Post-Discharge VTE Risk Assessment: This patient's probability of 30-day post-discharge VTE is increased due to the factors marked: X Sleeve gastrectomy   Liver disorder (transplant, cirrhosis, or nonalcoholic steatohepatitis)   Hx of VTE   Hemorrhage requiring transfusion   GI perforation, leak, or obstruction   ====================================================    Female    Age >/=60 years    BMI >/=50 kg/m2    CHF    Dyspnea at Rest    Paraplegia   X Non-gastric-band surgery    Operation Time >/=3 hr    Return to OR     Length of Stay >/= 3 d   Hypercoagulable condition   Significant venous stasis     Predicted probability of 30-day post-discharge VTE: 0.16%   Recommendation for Discharge: No pharmacologic prophylaxis post-discharge   Virginia [name] is a 47 y.o. female who underwent laparoscopic sleeve gastrectomy on 05/12/23.    Case start: 0801 Case end: 0903   No Known Allergies  Patient Measurements: Height: 5' 6.5" (168.9 cm) Weight: 113.2 kg (249 lb 9.6 oz) IBW/kg (Calculated) : 60.45 Body mass index is 39.68 kg/m.  No results for input(s): "WBC", "HGB", "HCT", "PLT", "APTT", "CREATININE", "LABCREA", "CREAT24HRUR", "MG", "PHOS", "ALBUMIN", "PROT", "AST", "ALT", "ALKPHOS", "BILITOT", "BILIDIR", "IBILI" in the last 72 hours. Estimated Creatinine Clearance: 104.1 mL/min (by C-G formula based on SCr of 0.87 mg/dL).    Past Medical History:  Diagnosis Date   Fatigue    GERD (gastroesophageal reflux disease)    Headaches due to old head injury    Hypertension    Neck pain    Sleep apnea    no cpap   Sleep difficulties      Medications Prior to Admission  Medication Sig Dispense Refill Last Dose   acetaminophen (TYLENOL) 500 MG tablet Take 1,000 mg by mouth every 6 (six) hours as needed for moderate pain.   05/11/2023   CAMPHOR EX Apply 1  Application topically daily as needed (pain).   Past Week   cholecalciferol (VITAMIN D3) 25 MCG (1000 UNIT) tablet Take 1,000 Units by mouth daily.   05/11/2023   losartan (COZAAR) 50 MG tablet Take 50 mg by mouth daily.   05/11/2023   Multiple Vitamin (MULTIVITAMIN WITH MINERALS) TABS tablet Take 1 tablet by mouth daily.   05/11/2023   Multiple Vitamins-Minerals (HAIR SKIN & NAILS) TABS Take 3 tablets by mouth daily.   05/11/2023   oxybutynin (DITROPAN-XL) 10 MG 24 hr tablet Take 10 mg by mouth daily.   05/11/2023   phentermine 37.5 MG capsule Take 37.5 mg by mouth every morning.   04/28/2023     Cherylin Mylar, PharmD Clinical Pharmacist  7/16/20241:17 PM

## 2023-05-12 NOTE — Op Note (Signed)
Preop Diagnosis: Obesity Class II with major comorbidity  Postop Diagnosis: same  Procedure performed: laparoscopic Sleeve Gastrectomy  Assitant: Phylliss Blakes  Indications:  The patient is a 47 y.o. year-old morbidly obese female who has been followed in the Bariatric Clinic as an outpatient. This patient was diagnosed with morbid obesity with a BMI of Body mass index is 39.68 kg/m. and significant co-morbidities including hypertension.  The patient was counseled extensively in the Bariatric Outpatient Clinic and after a thorough explanation of the risks and benefits of surgery (including death from complications, bowel leak, infection such as peritonitis and/or sepsis, internal hernia, bleeding, need for blood transfusion, bowel obstruction, organ failure, pulmonary embolus, deep venous thrombosis, wound infection, incisional hernia, skin breakdown, and others entailed on the consent form) and after a compliant diet and exercise program, the patient was scheduled for an elective laparoscopic sleeve gastrectomy.  Description of Operation:  Following informed consent, the patient was taken to the operating room and placed on the operating table in the supine position.  She had previously received prophylactic antibiotics and subcutaneous heparin for DVT prophylaxis in the pre-op holding area.  After induction of general endotracheal anesthesia by the anesthesiologist, the patient underwent placement of sequential compression devices and an oro-gastric tube.  A timeout was confirmed by the surgery and anesthesia teams.  The patient was adequately padded at all pressure points and placed on a footboard to prevent slippage from the OR table during extremes of position during surgery.  She underwent a routine sterile prep and drape of her entire abdomen.    Next, A transverse incision was made under the left subcostal area and a 5mm optical viewing trocar was introduced into the peritoneal cavity.  Pneumoperitoneum was applied with a high flow and low pressure. A laparoscope was inserted to confirm placement. A extraperitoneal block was then placed at the lateral abdominal wall using exparel diluted with marcaine. 5 additional incisions were placed: 1 5mm trocar to the left of the midline. 1 additional 5mm trocar in the left lateral area, 1 12mm trocar in the right mid abdomen, 1 5mm trocar in the right subcostal area, and a Nathanson retractor was placed through a subxiphoid incision.  The UGI showed a small hiatal hernia. Therefore, the pars flaccida was incised with harmonic scalpel. The stomach was reduced but on dissection of the posterior crus there was a visible hernia with small sac. The sac was dissected free and 1 0 ethibond sutures placed in interrupted fashion. A calibration tube was passed to ensure appropriate size of the hiatus.   Next, a hole was created through the lesser omentum along the greater curve of the stomach to enter the lesser sac. The vessels along the greater omentum were  Then ligated and divided using the Harmonic scalpel moving towards the spleen and then short gastric vessels were ligated and divided in the same fashion to fully mobilize the fundus. The left crus was identified to ensure completion of the dissection. Next the antrum was measured and dissection continued inferiorly along the greater curve towards the pylorus and stopped 6cm from the pylorus.   A 40Fr ViSiGi dilator was placed into the esophgaus and along the lesser curve of the stomach and placed on suction. 2 60mm Gold load echelon stapler(s) followed by 3 60mm blue load echelon stapler(s) were used to make the resection along the antrum being sure to stay well away from the angularis by angling the jaws of the stapler towards the greater curve and  later completing the resection staying along the ViSiGi and ensuring the fundus was not retained by appropriately retracting it lateral. Air was inserted  through the ViSiGi to perform a leak test showing no bubbles and a neutral lie of the stomach.  The assistant then went and performed an upper endoscopy and leak test. No bubbles were seen and the sleeve and antrum distended appropriately. The specimen was then placed in an endocatch bag and removed by the 15mm port. The pressure was changed to 8 mm Hg. There was one area of bleeding near the spleen that additional harmonic clamps were used for hemostasis.  There were 2 areas of bleeding along the staple line that were reinforced with clips Hemostasis was ensured. The fascia of the 15mm port was closed with a 0 vicryl by suture passer. Pneumoperitoneum was evacuated, all ports were removed and all incisions closed with 4-0 monocryl suture in subcuticular fashion. Steristrips and bandaids were put in place for dressing. The patient awoke from anesthesia and was brought to pacu in stable condition. All counts were correct.  Estimated blood loss: <43ml  Specimens:  Sleeve gastrectomy  Local Anesthesia: 50 ml Exparel:0.5% Marcaine mix  Post-Op Plan:       Pain Management: PO, prn      Antibiotics: Prophylactic      Anticoagulation: Prophylactic, Starting now      Post Op Studies/Consults: Not applicable      Intended Discharge: within 48h      Intended Outpatient Follow-Up: Two Week      Intended Outpatient Studies: Not Applicable      Other: Not Applicable  De Blanch Ewa Hipp

## 2023-05-13 ENCOUNTER — Encounter (HOSPITAL_COMMUNITY): Payer: Self-pay | Admitting: General Surgery

## 2023-05-13 MED ORDER — PANTOPRAZOLE SODIUM 40 MG PO TBEC: 40.0000 mg | DELAYED_RELEASE_TABLET | Freq: Every day | ORAL | 0 refills | Status: AC

## 2023-05-13 MED ORDER — ACETAMINOPHEN 500 MG PO TABS: 1000.0000 mg | ORAL_TABLET | Freq: Three times a day (TID) | ORAL | Status: AC

## 2023-05-13 MED ORDER — ONDANSETRON 4 MG PO TBDP: 4.0000 mg | ORAL_TABLET | Freq: Four times a day (QID) | ORAL | 0 refills | Status: AC | PRN

## 2023-05-13 MED ORDER — GABAPENTIN 100 MG PO CAPS: 200.0000 mg | ORAL_CAPSULE | Freq: Two times a day (BID) | ORAL | 0 refills | Status: AC

## 2023-05-13 NOTE — Discharge Summary (Signed)
Physician Discharge Summary  Virginia [name] ZOX:[id number] DOB: 18-Sep-1976 DOA: 05/12/2023  PCP: Patient, No Pcp Per  Admit date: 05/12/2023 Discharge date:  05/13/2023   Recommendations for Outpatient Follow-up:   (include homehealth, outpatient follow-up instructions, specific recommendations for PCP to follow-up on, etc.)   Follow-up Information     Kayleena Eke, De Blanch, MD Follow up.   Specialty: General Surgery Why: Please arrive 15 minutes prior to your appointment at 9am Contact information: [address] [place] [address] [phone]         Maczis, Hedda Slade, PA-C Follow up.   Specialty: General Surgery Why: Please arrive 15 minutes prior to your appointment at 9:15am with Virginia [name] on behalf of Dr. Paris Lore information: [address] [facility] [place] [address] [phone]                Discharge Diagnoses:  Principal Problem:   Class II obesity   Surgical Procedure: laparoscopic sleeve gastrectomy, upper endoscopy  Discharge Condition: Good Disposition: Home  Diet recommendation: Postoperative sleeve gastrectomy diet (liquids only)  Filed Weights   05/12/23 0548 05/12/23 0610  Weight: 112.9 kg 113.2 kg     Hospital Course:  The patient was admitted after undergoing laparoscopic sleeve gastrectomy. POD 0 she ambulated well. POD 1 she was started on the water diet protocol and tolerated 180 ml in the first shift. Once meeting the water amount she was advanced to bariatric protein shakes which they tolerated and were discharged home POD 1.  Treatments: surgery: laparoscopic sleeve gastrectomy  Discharge Instructions  Discharge Instructions     Ambulate hourly while awake   Complete by: As directed    Call MD for:  difficulty breathing, headache or visual disturbances   Complete by: As directed    Call MD for:  persistant dizziness or light-headedness   Complete  by: As directed    Call MD for:  persistant nausea and vomiting   Complete by: As directed    Call MD for:  redness, tenderness, or signs of infection (pain, swelling, redness, odor or green/yellow discharge around incision site)   Complete by: As directed    Call MD for:  severe uncontrolled pain   Complete by: As directed    Call MD for:  temperature >101 F   Complete by: As directed    Diet bariatric full liquid   Complete by: As directed    Discharge wound care:   Complete by: As directed    Remove Bandaids tomorrow, ok to shower tomorrow. Steristrips may fall off in 1-3 weeks.   Incentive spirometry   Complete by: As directed    Perform hourly while awake      Allergies as of 05/13/2023   No Known Allergies      Medication List     STOP taking these medications    phentermine 37.5 MG capsule       TAKE these medications    acetaminophen 500 MG tablet Commonly known as: TYLENOL Take 2 tablets (1,000 mg total) by mouth every 8 (eight) hours for 5 days. What changed:  when to take this reasons to take this   CAMPHOR EX Apply 1 Application topically daily as needed (pain).   cholecalciferol 25 MCG (1000 UNIT) tablet Commonly known as: VITAMIN D3 Take 1,000 Units by mouth daily.   gabapentin 100 MG capsule Commonly known as: NEURONTIN Take 2 capsules (200 mg total) by mouth every 12 (twelve) hours.  Hair Skin & Nails Tabs Take 3 tablets by mouth daily.   losartan 50 MG tablet Commonly known as: COZAAR Take 50 mg by mouth daily.   multivitamin with minerals Tabs tablet Take 1 tablet by mouth daily.   ondansetron 4 MG disintegrating tablet Commonly known as: ZOFRAN-ODT Take 1 tablet (4 mg total) by mouth every 6 (six) hours as needed for nausea or vomiting.   oxybutynin 10 MG 24 hr tablet Commonly known as: DITROPAN-XL Take 10 mg by mouth daily.   pantoprazole 40 MG tablet Commonly known as: PROTONIX Take 1 tablet (40 mg total) by mouth  daily.               Discharge Care Instructions  (From admission, onward)           Start     Ordered   05/13/23 0000  Discharge wound care:       Comments: Remove Bandaids tomorrow, ok to shower tomorrow. Steristrips may fall off in 1-3 weeks.   05/13/23 0830            Follow-up Information     Calise Dunckel, De Blanch, MD Follow up.   Specialty: General Surgery Why: Please arrive 15 minutes prior to your appointment at 9am Contact information: [address] [place] [address] [phone]         Maczis, Hedda Slade, PA-C Follow up.   Specialty: General Surgery Why: Please arrive 15 minutes prior to your appointment at 9:15am with Virginia [name] on behalf of Dr. Paris Lore information: [address] [facility] [place] [address] [phone]                  The results of significant diagnostics from this hospitalization (including imaging, microbiology, ancillary and laboratory) are listed below for reference.    Significant Diagnostic Studies: No results found.  Labs: Basic Metabolic Panel: Recent Labs  Lab 05/12/23 1248  CREATININE 1.04*   Liver Function Tests: No results for input(s): "AST", "ALT", "ALKPHOS", "BILITOT", "PROT", "ALBUMIN" in the last 168 hours.  CBC: Recent Labs  Lab 05/12/23 1248  WBC 16.7*  HGB 13.8  HCT 43.2  MCV 92.9  PLT 303    CBG: No results for input(s): "GLUCAP" in the last 168 hours.  Principal Problem:   Class II obesity   VTE plan: no chemical prophylaxis recommended ([email or web address])  Time coordinating discharge: 15 min

## 2023-05-14 LAB — SURGICAL PATHOLOGY

## 2023-05-20 ENCOUNTER — Telehealth (HOSPITAL_COMMUNITY): Payer: Self-pay | Admitting: *Deleted

## 2023-05-20 NOTE — Telephone Encounter (Signed)
Call sent to voicemail Post-op follow-up

## 2023-05-26 ENCOUNTER — Telehealth (HOSPITAL_COMMUNITY): Payer: Self-pay | Admitting: *Deleted

## 2023-05-26 ENCOUNTER — Encounter: Payer: 59 | Admitting: Skilled Nursing Facility1

## 2023-05-26 ENCOUNTER — Ambulatory Visit: Payer: 59

## 2023-05-26 DIAGNOSIS — E669 Obesity, unspecified: Secondary | ICD-10-CM | POA: Insufficient documentation

## 2023-05-26 NOTE — Telephone Encounter (Signed)
Post-op follow-up  Voicemail with call back number left

## 2023-05-27 ENCOUNTER — Encounter: Payer: Self-pay | Admitting: Skilled Nursing Facility1

## 2023-05-27 NOTE — Progress Notes (Signed)
2 Week Post-Operative Nutrition Class   Patient was seen on 05/26/2023 for Post-Operative Nutrition education at the [facility].    Start weight at [facility]: 243.7 lbs (date: 09/11/2022)  Height: 66.5 in Weight today: pt declined  Bowel Habits: Every day to every other day no complaints   Body Composition Scale Pt declined  Current Body Weight   Total Body Fat %   Visceral Fat   Fat-Free Mass %    Total Body Water %   Muscle-Mass lbs   BMI   Body Fat Displacement          Torso  lbs          Left Leg  lbs          Right Leg  lbs          Left Arm  lbs          Right Arm  lbs       The following the learning objectives were met by the patient during this course: Identifies Soft Prepped Plan Advancement Guide  Identifies Soft, High Proteins (Phase 1), beginning 2 weeks post-operatively to 3 weeks post-operatively Identifies Additional Soft High Proteins, soft non-starchy vegetables, fruits and starches (Phase 2), beginning 3 weeks post-operatively to 3 months post-operatively Identifies appropriate sources of fluids, proteins, vegetables, fruits and starches Identifies appropriate fat sources and healthy verses unhealthy fat types   States protein, vegetable, fruit and starch recommendations and appropriate sources post-operatively Identifies the need for appropriate texture modifications, mastication, and bite sizes when consuming solids Identifies appropriate fat consumption and sources Identifies appropriate multivitamin and calcium sources post-operatively Describes the need for physical activity post-operatively and will follow MD recommendations States when to call healthcare provider regarding medication questions or post-operative complications   Handouts given during class include: Soft Prepped Plan Advancement Guide   Follow-Up Plan: Patient will follow-up at [facility] in 10 weeks for 3 month post-op nutrition visit for diet advancement per  MD.

## 2023-06-02 ENCOUNTER — Ambulatory Visit: Payer: 59

## 2023-06-04 ENCOUNTER — Telehealth: Payer: Self-pay | Admitting: Dietician

## 2023-06-04 NOTE — Telephone Encounter (Signed)
RD called pt to verify fluid intake once starting soft, solid proteins 2 week post-bariatric surgery.   Daily Fluid intake:  Daily Protein intake:  Bowel Habits:   Concerns/issues:    Left Voice Message 

## 2023-08-10 ENCOUNTER — Encounter: Payer: Self-pay | Admitting: Dietician

## 2023-08-10 ENCOUNTER — Encounter: Payer: 59 | Attending: General Surgery | Admitting: Dietician

## 2023-08-10 VITALS — Ht 66.5 in | Wt 222.1 lb

## 2023-08-10 DIAGNOSIS — E669 Obesity, unspecified: Secondary | ICD-10-CM | POA: Insufficient documentation

## 2023-08-10 NOTE — Progress Notes (Signed)
Bariatric Nutrition Follow-Up Visit Medical Nutrition Therapy  Appt Start Time: 2:03   End Time: 2:44  Surgery date: 05/12/2023 Surgery type: Sleeve Gastrectomy  NUTRITION ASSESSMENT  Anthropometrics  Start weight at [facility]: 243.7 lbs (date: 09/11/2022)  Height: 66.5 in Weight today: 222.1 lbs   Clinical  Medical hx: Sleep Apnea Medications: Oxybutynin, phentermine  Labs: Vit D 19.1 Notable signs/symptoms: none noted Any previous deficiencies? No  Body Composition Scale 08/10/2023  Weight  lbs 222.1  Total Body Fat  % 40.8     Visceral Fat 12  Fat-Free Mass  % 59.1     Total Body Water  % 44.0     Muscle-Mass  lbs 33.1  BMI 35.3  Body Fat Displacement ---        Torso  lbs 56.1        Left Leg  lbs 11.2        Right Leg  lbs 11.2        Left Arm  lbs 5.6        Right Arm  lbs 5.6   Lifestyle & Dietary Hx  Pt states she is always tired, stating she wasn't always tired. Pt states her blood pressure has been low after surgery, stating the surgeon asked her to reduce the amount of blood pressure medication.   Pt states she is struggling with fluid intake. Pt states she is taking 1500 mg of calcium at one time.  Estimated daily fluid intake: 32-48 oz Estimated daily protein intake: 60 g Supplements: multivitamin and calcium Current average weekly physical activity: walking some   24-Hr Dietary Recall First Meal: protein shake or omelet Snack: yogurt  Second Meal: egg and a piece of cheese or yogurt Snack:   Third Meal: chicken, fruit or soup Snack: sf jello or sf icee Beverages: water  Post-Op Goals/ Signs/ Symptoms Using straws: no Drinking while eating: no Chewing/swallowing difficulties: no Changes in vision: no Changes to mood/headaches: no Hair loss/changes to skin/nails: no Difficulty focusing/concentrating: no Sweating: no Limb weakness: no Dizziness/lightheadedness: no Palpitations: no  Carbonated/caffeinated beverages: no N/V/D/C/Gas:  no Abdominal pain: no Dumping syndrome: no   NUTRITION DIAGNOSIS  Overweight/obesity ([place]-3.3) related to past poor dietary habits and physical inactivity as evidenced by completed bariatric surgery and following dietary guidelines for continued weight loss and healthy nutrition status.   NUTRITION INTERVENTION Nutrition counseling (C-1) and education (E-2) to facilitate bariatric surgery goals, including: Diet advancement to the standard prep plan. The importance of consuming adequate calories as well as certain nutrients daily due to the body's need for essential vitamins, minerals, and fats The importance of daily physical activity and to reach a goal of at least 150 minutes of moderate to vigorous physical activity weekly (or as directed by their physician) due to benefits such as increased musculature and improved lab values The importance of intuitive eating specifically learning hunger-satiety cues and understanding the importance of learning a new body: The importance of mindful eating to avoid grazing behaviors  The importance of taking calcium 3 times a day, 500 mg at a time. Encouraged patient to honor their body's internal hunger and fullness cues.  Throughout the day, check in mentally and rate hunger. Stop eating when satisfied not full regardless of how much food is left on the plate.  Get more if still hungry 20-30 minutes later.  The key is to honor satisfaction so throughout the meal, rate fullness factor and stop when comfortably satisfied not physically full. The key is  to honor hunger and fullness without any feelings of guilt or shame.  Pay attention to what the internal cues are, rather than any external factors. This will enhance the confidence you have in listening to your own body and following those internal cues enabling you to increase how often you eat when you are hungry not out of appetite and stop when you are satisfied not full.  Encouraged pt to continue to eat  balanced meals inclusive of non starchy vegetables 2 times a day 7 days a week Encouraged pt to continue to drink a minium 64 fluid ounces with half being plain water to satisfy proper hydration   Why you need complex carbohydrates: Whole grains and other complex carbohydrates are required to have a healthy diet. Whole grains provide fiber which can help with blood glucose levels and help keep you satiated. Fruits and starchy vegetables provide essential vitamins and minerals required for immune function, eyesight support, brain support, bone density, wound healing and many other functions within the body. According to the current evidenced based 2020-2025 Dietary Guidelines for Americans, complex carbohydrates are part of a healthy eating pattern which is associated with a decreased risk for type 2 diabetes, cancers, and cardiovascular disease.   Goals Increase fluid; aim for 64 oz Take 500 mg of calcium 3 times a day Increase your non-starchy vegetables; aim for 2 or more servings per day. Include complex carbohydrates with meals; to help with low energy Get back to the gym.  Handouts Provided Include  Standard Prep Plan Advancement Guide  Learning Style & Readiness for Change Teaching method utilized: Visual & Auditory  Demonstrated degree of understanding via: Teach Back  Readiness Level: preparation Barriers to learning/adherence to lifestyle change: nothing identified  RD's Notes for Next Visit Assess adherence to pt chosen goals   MONITORING & EVALUATION Dietary intake, weekly physical activity, body weight.  Next Steps Patient is to follow-up in 3 months for 6 month post-op follow-up.

## 2023-11-02 ENCOUNTER — Encounter: Payer: Self-pay | Admitting: Dietician

## 2023-11-02 ENCOUNTER — Encounter: Payer: 59 | Attending: General Surgery | Admitting: Dietician

## 2023-11-02 VITALS — Ht 66.5 in | Wt 209.0 lb

## 2023-11-02 DIAGNOSIS — E669 Obesity, unspecified: Secondary | ICD-10-CM | POA: Insufficient documentation

## 2023-11-02 NOTE — Progress Notes (Signed)
 Bariatric Nutrition Follow-Up Visit Medical Nutrition Therapy  Appt Start Time: 0803   End Time: 0834  Surgery date: 05/12/2023 Surgery type: Sleeve Gastrectomy  NUTRITION ASSESSMENT  Anthropometrics  Start weight at [facility]: 243.7 lbs (date: 09/11/2022)  Height: 66.5 in Weight today: 209.0 lbs (pt declined body comp scale)   Clinical  Medical hx: Sleep Apnea Medications: Oxybutynin , phentermine  Labs: Vit D 19.1 Notable signs/symptoms: none noted Any previous deficiencies? No  Body Composition Scale 08/10/2023  Weight  lbs 222.1  Total Body Fat  % 40.8     Visceral Fat 12  Fat-Free Mass  % 59.1     Total Body Water   % 44.0     Muscle-Mass  lbs 33.1  BMI 35.3  Body Fat Displacement ---        Torso  lbs 56.1        Left Leg  lbs 11.2        Right Leg  lbs 11.2        Left Arm  lbs 5.6        Right Arm  lbs 5.6   Lifestyle & Dietary Hx  Pt states lunch is hard, stating she is glued to the computer. Pt states her blood pressure was high, stating the doctor put her back on medication. Pt states she is splitting 750 calcium supplement in two and taking a half 4 times per day.  Estimated daily fluid intake: 58 oz Estimated daily protein intake: 50-65 g Supplements: multivitamin and calcium Current average weekly physical activity: gym daily to 4 days a week, 60-90 minutes (treadmill for 60 minutes and then weights)  24-Hr Dietary Recall First Meal: 0430 water , protein shake Snack: 0930 water  egg bites Second Meal: yogurt with granola Snack:  Third Meal: boiled eggs with turkey Snack: hot tea Beverages: water , de-caf coffee, hot tea  Post-Op Goals/ Signs/ Symptoms Using straws: no Drinking while eating: no Chewing/swallowing difficulties: no Changes in vision: no Changes to mood/headaches: no Hair loss/changes to skin/nails: no Difficulty focusing/concentrating: no Sweating: no Limb weakness: no Dizziness/lightheadedness: no Palpitations: no   Carbonated/caffeinated beverages: no N/V/D/C/Gas: no Abdominal pain: no Dumping syndrome: no   NUTRITION DIAGNOSIS  Overweight/obesity ([place]-3.3) related to past poor dietary habits and physical inactivity as evidenced by completed bariatric surgery and following dietary guidelines for continued weight loss and healthy nutrition status.   NUTRITION INTERVENTION Nutrition counseling (C-1) and education (E-2) to facilitate bariatric surgery goals, including: The importance of consuming adequate calories as well as certain nutrients daily due to the body's need for essential vitamins, minerals, and fats The importance of daily physical activity and to reach a goal of at least 150 minutes of moderate to vigorous physical activity weekly (or as directed by their physician) due to benefits such as increased musculature and improved lab values The importance of intuitive eating specifically learning hunger-satiety cues and understanding the importance of learning a new body: The importance of mindful eating to avoid grazing behaviors  The importance of taking calcium 3 times a day, 500 mg at a time (or 375 mg 4 times a day) Encouraged patient to honor their body's internal hunger and fullness cues.  Throughout the day, check in mentally and rate hunger. Stop eating when satisfied not full regardless of how much food is left on the plate.  Get more if still hungry 20-30 minutes later.  The key is to honor satisfaction so throughout the meal, rate fullness factor and stop when comfortably satisfied not physically  full. The key is to honor hunger and fullness without any feelings of guilt or shame.  Pay attention to what the internal cues are, rather than any external factors. This will enhance the confidence you have in listening to your own body and following those internal cues enabling you to increase how often you eat when you are hungry not out of appetite and stop when you are satisfied not full.   Encouraged pt to continue to eat balanced meals inclusive of non starchy vegetables 2 times a day 7 days a week Encouraged pt to continue to drink a minium 64 fluid ounces with half being plain water  to satisfy proper hydration   Why you need complex carbohydrates: Whole grains and other complex carbohydrates are required to have a healthy diet. Whole grains provide fiber which can help with blood glucose levels and help keep you satiated. Fruits and starchy vegetables provide essential vitamins and minerals required for immune function, eyesight support, brain support, bone density, wound healing and many other functions within the body. According to the current evidenced based 2020-2025 Dietary Guidelines for Americans, complex carbohydrates are part of a healthy eating pattern which is associated with a decreased risk for type 2 diabetes, cancers, and cardiovascular disease.   Goals Continue: Increase fluid; aim for 64 oz Continue: Take 500 mg of calcium 3 times a day (375 mg 4 times a day... half of 750) Re-engage: Increase your non-starchy vegetables; aim for 2 or more servings per day. New: Prep lunch and snacks Re-engage: Include complex carbohydrates with meals; to help with low energy Continue: Get back to the gym.  Handouts Provided Include  Bariatric 6 Months to Maintenance Healthy Eating Guide  Learning Style & Readiness for Change Teaching method utilized: Visual & Auditory  Demonstrated degree of understanding via: Teach Back  Readiness Level: preparation Barriers to learning/adherence to lifestyle change: nothing identified  RD's Notes for Next Visit Assess adherence to pt chosen goals  MONITORING & EVALUATION Dietary intake, weekly physical activity, body weight.  Next Steps Patient is to follow-up in 3 months for 9 month post-op follow-up.

## 2024-02-01 ENCOUNTER — Ambulatory Visit: Payer: 59 | Admitting: Dietician

## 2024-02-15 ENCOUNTER — Encounter: Payer: Self-pay | Admitting: Dietician

## 2024-02-15 ENCOUNTER — Encounter: Attending: General Surgery | Admitting: Dietician

## 2024-02-15 VITALS — Ht 66.5 in | Wt 203.6 lb

## 2024-02-15 DIAGNOSIS — E669 Obesity, unspecified: Secondary | ICD-10-CM | POA: Diagnosis present

## 2024-02-15 NOTE — Progress Notes (Signed)
 Bariatric Nutrition Follow-Up Visit Medical Nutrition Therapy  Appt Start Time: 0807   End Time: 0836  Surgery date: 05/12/2023 Surgery type: Sleeve Gastrectomy  NUTRITION ASSESSMENT  Anthropometrics  Start weight at [facility]: 243.7 lbs (date: 09/11/2022)  Height: 66.5 in Weight today: 203.6 lbs (pt declined body comp scale)   Clinical  Medical hx: Sleep Apnea Medications: Oxybutynin , phentermine  Labs: Vit D 19.1 Notable signs/symptoms: none noted Any previous deficiencies? No  Body Composition Scale 08/10/2023  Weight  lbs 222.1  Total Body Fat  % 40.8     Visceral Fat 12  Fat-Free Mass  % 59.1     Total Body Water   % 44.0     Muscle-Mass  lbs 33.1  BMI 35.3  Body Fat Displacement ---        Torso  lbs 56.1        Left Leg  lbs 11.2        Right Leg  lbs 11.2        Left Arm  lbs 5.6        Right Arm  lbs 5.6   Lifestyle & Dietary Hx  Pt states every time she goes to [place]  it is hard to get back in her routine at the gym. Pt states people complain because she eats so slow. Pt states lunch is hard, stating she is glued to the computer. Pt states she is interested in the [facility] program.  Estimated daily fluid intake: 44-56 oz Estimated daily protein intake: 60+ g Supplements: multivitamin and calcium Current average weekly physical activity: walk 2-3 days per week, 45 minutes (haven't been to the gym lately)  24-Hr Dietary Recall First Meal: 0430 water , protein shake, coffee, banana  Snack: 0930 water  egg bites and blueberries Second Meal: skip or yogurt with granola Snack: protein shake Third Meal: boiled eggs with [place], greek yogurt Snack: Beverages: water , de-caf coffee  Post-Op Goals/ Signs/ Symptoms Using straws: no Drinking while eating: no Chewing/swallowing difficulties: no Changes in vision: no Changes to mood/headaches: no Hair loss/changes to skin/nails: no Difficulty focusing/concentrating: no Sweating: no Limb weakness:  no Dizziness/lightheadedness: no Palpitations: no  Carbonated/caffeinated beverages: no N/V/D/C/Gas: no Abdominal pain: no Dumping syndrome: no   NUTRITION DIAGNOSIS  Overweight/obesity ([place]-3.3) related to past poor dietary habits and physical inactivity as evidenced by completed bariatric surgery and following dietary guidelines for continued weight loss and healthy nutrition status.   NUTRITION INTERVENTION Nutrition counseling (C-1) and education (E-2) to facilitate bariatric surgery goals, including: The importance of consuming adequate calories as well as certain nutrients daily due to the body's need for essential vitamins, minerals, and fats The importance of daily physical activity and to reach a goal of at least 150 minutes of moderate to vigorous physical activity weekly (or as directed by their physician) due to benefits such as increased musculature and improved lab values The importance of intuitive eating specifically learning hunger-satiety cues and understanding the importance of learning a new body: The importance of mindful eating to avoid grazing behaviors  The importance of taking calcium 3 times a day, 500 mg at a time (or 375 mg 4 times a day) Encouraged patient to honor their body's internal hunger and fullness cues.  Throughout the day, check in mentally and rate hunger. Stop eating when satisfied not full regardless of how much food is left on the plate.  Get more if still hungry 20-30 minutes later.  The key is to honor satisfaction so throughout the meal, rate  fullness factor and stop when comfortably satisfied not physically full. The key is to honor hunger and fullness without any feelings of guilt or shame.  Pay attention to what the internal cues are, rather than any external factors. This will enhance the confidence you have in listening to your own body and following those internal cues enabling you to increase how often you eat when you are hungry not out of  appetite and stop when you are satisfied not full.  Encouraged pt to continue to eat balanced meals inclusive of non starchy vegetables 2 times a day 7 days a week Encouraged pt to continue to drink a minium 64 fluid ounces with half being plain water  to satisfy proper hydration   Why you need complex carbohydrates: Whole grains and other complex carbohydrates are required to have a healthy diet. Whole grains provide fiber which can help with blood glucose levels and help keep you satiated. Fruits and starchy vegetables provide essential vitamins and minerals required for immune function, eyesight support, brain support, bone density, wound healing and many other functions within the body. According to the current evidenced based 2020-2025 Dietary Guidelines for Americans, complex carbohydrates are part of a healthy eating pattern which is associated with a decreased risk for type 2 diabetes, cancers, and cardiovascular disease.   Goals Continue: Increase fluid; aim for 64 oz Continue: Take 500 mg of calcium 3 times a day (375 mg 4 times a day... half of 750) Re-engage: Increase your non-starchy vegetables; aim for 2 or more servings per day. Re-engage: Prep lunch and snacks Re-engage: Include complex carbohydrates with meals; to help with low energy New: Check out [facility]  Handouts Provided Include  [facility] Flyer Goals Printed Bariatric MyPlate  Learning Style & Readiness for Change Teaching method utilized: Visual & Auditory  Demonstrated degree of understanding via: Teach Back  Readiness Level: preparation Barriers to learning/adherence to lifestyle change: nothing identified  RD's Notes for Next Visit Assess adherence to pt chosen goals  MONITORING & EVALUATION Dietary intake, weekly physical activity, body weight.  Next Steps Patient is to follow-up in 3 months for 1 year post-op follow-up.

## 2024-05-16 ENCOUNTER — Ambulatory Visit: Admitting: Dietician

## 2024-12-02 ENCOUNTER — Encounter (HOSPITAL_COMMUNITY): Payer: Self-pay | Admitting: *Deleted
